# Patient Record
Sex: Male | Born: 1955 | Race: Black or African American | Hispanic: No | Marital: Single | Smoking: Never smoker
Health system: Southern US, Community
[De-identification: ages and names within clinical notes are randomized; demographics above are authoritative.]

## PROBLEM LIST (undated history)

## (undated) DIAGNOSIS — I10 Essential (primary) hypertension: Secondary | ICD-10-CM

## (undated) HISTORY — PX: BACK SURGERY: SHX140

---

## 2000-04-16 ENCOUNTER — Emergency Department (HOSPITAL_COMMUNITY): Admission: EM | Admit: 2000-04-16 | Discharge: 2000-04-16 | Payer: Self-pay | Admitting: Emergency Medicine

## 2000-07-19 ENCOUNTER — Encounter: Payer: Self-pay | Admitting: Emergency Medicine

## 2000-07-19 ENCOUNTER — Inpatient Hospital Stay (HOSPITAL_COMMUNITY): Admission: EM | Admit: 2000-07-19 | Discharge: 2000-07-20 | Payer: Self-pay | Admitting: Neurosurgery

## 2000-08-05 ENCOUNTER — Ambulatory Visit (HOSPITAL_COMMUNITY): Admission: RE | Admit: 2000-08-05 | Discharge: 2000-08-06 | Payer: Self-pay | Admitting: Neurosurgery

## 2002-11-21 ENCOUNTER — Emergency Department (HOSPITAL_COMMUNITY): Admission: EM | Admit: 2002-11-21 | Discharge: 2002-11-21 | Payer: Self-pay | Admitting: Emergency Medicine

## 2002-11-21 ENCOUNTER — Encounter: Payer: Self-pay | Admitting: Emergency Medicine

## 2004-03-04 ENCOUNTER — Emergency Department (HOSPITAL_COMMUNITY): Admission: EM | Admit: 2004-03-04 | Discharge: 2004-03-04 | Payer: Self-pay | Admitting: Emergency Medicine

## 2004-03-10 ENCOUNTER — Encounter: Admission: RE | Admit: 2004-03-10 | Discharge: 2004-03-10 | Payer: Self-pay | Admitting: Internal Medicine

## 2005-07-15 ENCOUNTER — Emergency Department (HOSPITAL_COMMUNITY): Admission: EM | Admit: 2005-07-15 | Discharge: 2005-07-16 | Payer: Self-pay | Admitting: Emergency Medicine

## 2006-05-18 ENCOUNTER — Emergency Department (HOSPITAL_COMMUNITY): Admission: EM | Admit: 2006-05-18 | Discharge: 2006-05-18 | Payer: Self-pay | Admitting: Emergency Medicine

## 2007-04-08 ENCOUNTER — Emergency Department (HOSPITAL_COMMUNITY): Admission: EM | Admit: 2007-04-08 | Discharge: 2007-04-08 | Payer: Self-pay | Admitting: Emergency Medicine

## 2007-09-14 ENCOUNTER — Emergency Department (HOSPITAL_COMMUNITY): Admission: EM | Admit: 2007-09-14 | Discharge: 2007-09-14 | Payer: Self-pay | Admitting: Emergency Medicine

## 2008-07-07 ENCOUNTER — Emergency Department (HOSPITAL_COMMUNITY): Admission: EM | Admit: 2008-07-07 | Discharge: 2008-07-07 | Payer: Self-pay | Admitting: Emergency Medicine

## 2008-08-21 ENCOUNTER — Emergency Department (HOSPITAL_COMMUNITY): Admission: EM | Admit: 2008-08-21 | Discharge: 2008-08-21 | Payer: Self-pay | Admitting: Emergency Medicine

## 2009-01-31 ENCOUNTER — Emergency Department (HOSPITAL_COMMUNITY): Admission: EM | Admit: 2009-01-31 | Discharge: 2009-01-31 | Payer: Self-pay | Admitting: Emergency Medicine

## 2009-05-15 ENCOUNTER — Emergency Department (HOSPITAL_COMMUNITY): Admission: EM | Admit: 2009-05-15 | Discharge: 2009-05-15 | Payer: Self-pay | Admitting: Emergency Medicine

## 2009-05-16 ENCOUNTER — Ambulatory Visit: Payer: Self-pay | Admitting: Internal Medicine

## 2009-05-16 DIAGNOSIS — E109 Type 1 diabetes mellitus without complications: Secondary | ICD-10-CM | POA: Insufficient documentation

## 2009-08-19 ENCOUNTER — Emergency Department (HOSPITAL_COMMUNITY): Admission: EM | Admit: 2009-08-19 | Discharge: 2009-08-19 | Payer: Self-pay | Admitting: Family Medicine

## 2011-03-31 LAB — POCT I-STAT, CHEM 8
BUN: 9 mg/dL (ref 6–23)
Calcium, Ion: 1.21 mmol/L (ref 1.12–1.32)
Chloride: 105 mEq/L (ref 96–112)
Creatinine, Ser: 1.2 mg/dL (ref 0.4–1.5)
Glucose, Bld: 84 mg/dL (ref 70–99)

## 2011-05-11 NOTE — Op Note (Signed)
[place]. [facility]  Patient:    Ricky Savage, Ricky Savage                       MRN: [id number] Proc. Date: 08/05/00 Adm. Date:  [id number] Attending:  Tressie Stalker D                           Operative Report  BRIEF HISTORY:  The patient is a 55 year old black male who has suffered from a several week history of severe back and right leg pain.  The patient failed medical management and was worked up with a lumbar MRI that demonstrated large herniated nucleus pulposus at L5-S1 on the right. The patient therefore weighed the risks, benefits and alternatives of surgery and decided to proceed with a microdiskectomy.  PREOPERATIVE DIAGNOSIS:  L5-S1 degenerative disease, herniated nucleus pulposus, spinal stenosis, and lumbar radiculopathy.  POSTOPERATIVE DIAGNOSIS:  L5-S1 degenerative disease, herniated nucleus pulposus, spinal stenosis, and lumbar radiculopathy.  PROCEDURE:  Right L5-S1 microdiskectomy using microdissection.  SURGEON:  Cristi Loron, M.D.  ASSISTANT:  Hewitt Shorts, M.D.  ANESTHESIA:  General endotracheal anesthesia.  ESTIMATED BLOOD LOSS:  250 cc.  SPECIMENS:  None.  DRAINS:  None.  COMPLICATIONS:  None.  DESCRIPTION OF PROCEDURE:  The patient was brought to the operating room by the anesthesia team.  General endotracheal anesthesia was induced.  The patient was then turned to the prone position on the Wilson frame.  His lumbosacral region was then prepared with Betadine scrub and Betadine solution.  Sterile drapes were applied, and then I injected the area to be incised with Marcaine with epinephrine solution.  I then used a scalpel to make a vertical incision over the L5-S1 interspace in the midline.  I used electrocautery to dissect down to thoracolumbar fascia, divided the fascia just to the right of midline, and performed a right-sided dissection, stripping the paraspinous musculature from the right spinous process of  the lamina of L5 and the upper sacrum.   I inserted a McCullough retractor for exposure and obtained an intraoperative radiograph to confirm my location.  I then brought the operating microscope into the field and under its magnification and illumination, I completed decompression.  I used a Midas Rex high speed drill to perform a right L5 laminotomy drilling in a cephalad direction until I encountered the insertion of ligament of flavum. I then removed the right L5-S1 ligament of flavum with a Kerrison punch, widening the laminotomy, exposed the thecal sac, and identified a huge ruptured disk in the lateral recess which had sandwiched itself between the right S1 and S2 nerve root.  Using microdissection I freed up the disk herniation from the nerve roots and performed a diskectomy using the pituitary forceps.  I then used the coronary dilator to feel about the thecal sac and the right S1 and S2 nerve roots, and noted that all the free fragments were removed.  I then carefully retracted the thecal sac and the S1, S2 nerves medially and then inspected the intervertebral disk space.  There was a small hole in the annulus but it was quite small and I therefore I chose not to enter into the interspace.  I coagulated some epidural veins.  I then copiously irrigated the wound out with bacitracin solution, removed the solution.  I palpated about the ventral surface of the thecal sac and around the right S1 and S2 nerve roots and noted  that the neural structures were well decompressed and then removed the McCullough retractor and then reapproximated the patients back and lumbar fascia with interrupted #1 Vicryl, the subcutaneous tissue with interrupted 2-0 Vicryl and the skin with Steri-Strips and benzoin.  The wounds were then coated with bacitracin ointment.  A sterile dressing was applied and the drapes were removed and the patient was returned to his supine position where he was extubated by  the anesthesia team and transported to the post anesthesia care unit in stable condition.  All sponge, instrument and needle counts were correct at the end of this case. DD:  08/05/00 TD:  08/06/00 Job: [id number]
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
## 2011-05-11 NOTE — Discharge Summary (Signed)
[place]. [facility]  Patient:    Ricky Savage                        MRN: [id number] Adm. Date:  [id number] Disc. Date: [id number] Attending:  Devoria Albe                           Discharge Summary  For full details of this admission, please refer to typed history and physical.  BRIEF HISTORY:  The patient is a 55 year old black male, who has suffered from some chronic back pain and began having severe right leg pain yesterday.  He was evaluated at the [facility] Emergency Department and neurosurgical consultation was requested.  I subsequently admitted him for IV pain control, i.e. with intractable lumbago and leg pain.  For past medical history, past surgical history, medication prior to admission, drug allergies, family medical history, social history, admission physical examination, imaging studies, assessment and plan, etc., please refer to the transcribed history and physical.  HOSPITAL COURSE:  I admitted the patient with intractable lumbar radiculopathy and lumbago.  A lumbar MRI was performed and demonstrated a herniated nucleus pulposus at L5-S1 on the right.  The patients physical examination was consistent with the right S1 radiculopathy.  I discussed the situation with the patient and discussed the various treatment options with him including continuing medical management, given more time, steroid injections, and surgery.  I described the procedure of a L5-S1 microdiskectomy and I discussed the risks of surgery including risks of anesthesia, hemorrhage, infection, dural tear, injury to the lumbar nerve root causing temporary or permanent leg pain, numbness, weakness, small chance of ______ syndrome, recurrent disk herniation, failure to relieve the pain, worsening of the pain, etc.  By July 20, 2000, the patients pain was adequately under control with p.o. medications and we therefore decided to discharge him home and he wants to proceed  with the microdiskectomy at some point next week.  I have asked him to call me on Monday and we will make the necessary arrangements.  FINAL DIAGNOSES:  L5-S1 degenerative disease, herniated nucleus pulposus, spinal stenosis, lumbar radiculopathy.  DISCHARGE MEDICATIONS: 1. Percocet 5, #60, one or two p.o. q.4h. p.r.n. for pain limiting 8 per    day, no refills. 2. Medrol Dosepak #1 take as directed, no refills. 3. Valium 5 mg, #40, one p.o. q.6h. p.r.n. for muscle spasm, one refill. DD:  07/20/00 TD:  07/23/00 Job: [id number]

## 2011-05-11 NOTE — H&P (Signed)
[place]. [facility]  Patient:    Ricky Savage, Ricky Savage                       MRN: [id number] Adm. Date:  [id number] Attending:  Tressie Stalker D                         History and Physical  NO DICTATION. DD:  08/05/00 TD:  08/05/00 Job: [id number]

## 2011-05-11 NOTE — Discharge Summary (Signed)
[place]. [facility]  Patient:    Ricky Savage, Ricky Savage                       MRN: [id number] Adm. Date:  [id number] Disc. Date: [id number] Attending:  Tressie Stalker D                           Discharge Summary  For full details of this admission, please refer to typed history and physical.  BRIEF HISTORY:  The patient is a 56 year old black male who suffered from back and right leg pain unresponsive to medical management.  He was worked up with a lumbar MRI that demonstrated a large herniated disc at L5-S1 on the right. The patient weighed the risks, benefits, and alternatives of surgery and decided to proceed with a microdiskectomy.  For past medical history, past surgical history, medications prior to admission, drug allergies, family medical history, social history, admission physical examination, imaging status, assessment and plat, etc., please refer to typed history and physical.  HOSPITAL COURSE:  I performed an L5-S1 microdiskectomy on the right without complications on August 05, 2000, (for full details of this operation, please refer to typed operative note).  POSTOPERATIVE COURSE:  The patients postoperative course was unremarkable. By postoperative day #1 he was eating well, ambulating well, his wound was healing well without signs of infection.  He was afebrile and vital signs were stable.  Motor strength was normal and his leg pain had resolved.  He requested discharge home.  I, therefore, discharged him on August 06, 2000.  DISCHARGE MEDICATIONS: 1. Percocet #60 one to two p.o. q.4h. p.r.n. for pain with no refills. 2. Valium 5 mg #40 one p.o. q.6h. p.r.n. for muscle spasm with one refill.  DISCHARGE INSTRUCTIONS:  The patient was given written discharge instructions.  FOLLOW-UP:  He was instructed to follow up with me in three weeks.  FINAL DIAGNOSES: 1. L5-S1 degenerative disease. 2. Herniated nucleus pulposus. 3. Spinal stenosis. 4.  Lumbar radiculopathy.  PROCEDURE PERFORMED:  A right L5-S1 microdiskectomy using microdissection. DD:  08/06/00 TD:  08/07/00 Job: [id number]
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
## 2012-03-15 ENCOUNTER — Emergency Department (HOSPITAL_COMMUNITY)
Admission: EM | Admit: 2012-03-15 | Discharge: 2012-03-16 | Disposition: A | Discharge status: Home / Self Care | Payer: Medicaid Other | Attending: Emergency Medicine | Admitting: Emergency Medicine

## 2012-03-15 ENCOUNTER — Encounter (HOSPITAL_COMMUNITY): Payer: Self-pay

## 2012-03-15 ENCOUNTER — Emergency Department (HOSPITAL_COMMUNITY): Payer: Medicaid Other

## 2012-03-15 DIAGNOSIS — X500XXA Overexertion from strenuous movement or load, initial encounter: Secondary | ICD-10-CM | POA: Insufficient documentation

## 2012-03-15 DIAGNOSIS — M7989 Other specified soft tissue disorders: Secondary | ICD-10-CM | POA: Insufficient documentation

## 2012-03-15 DIAGNOSIS — M25569 Pain in unspecified knee: Secondary | ICD-10-CM | POA: Insufficient documentation

## 2012-03-15 DIAGNOSIS — M25561 Pain in right knee: Secondary | ICD-10-CM

## 2012-03-15 DIAGNOSIS — I10 Essential (primary) hypertension: Secondary | ICD-10-CM | POA: Insufficient documentation

## 2012-03-15 HISTORY — DX: Essential (primary) hypertension: I10

## 2012-03-15 MED ORDER — HYDROCODONE-ACETAMINOPHEN 5-325 MG PO TABS: 1.0000 | ORAL_TABLET | ORAL | Status: AC | PRN

## 2012-03-15 MED ORDER — IBUPROFEN 800 MG PO TABS
800.0000 mg | ORAL_TABLET | Freq: Once | ORAL | Status: AC
  Administered 2012-03-15: 800 mg via ORAL
  Filled 2012-03-15: qty 1

## 2012-03-15 NOTE — ED Notes (Signed)
Pt presents with no acute distress twisted right knee Thursday- Increased pain with walking- more at night with sleep- no deformity

## 2012-03-15 NOTE — ED Provider Notes (Signed)
History     CSN: [id number]  Arrival date & time 03/15/12  2012   First MD Initiated Contact with Patient 03/15/12 2145      Chief Complaint  Patient presents with  . Knee Pain    (Consider location/radiation/quality/duration/timing/severity/associated sxs/prior treatment) HPI Comments: Patient here with right knee pain - states that he has a long history of pain and injury in the knee since the past 10 years - states that on Thursday he twisted the knee and then began to have pain behind his right knee - states that he has not seen an orthopedic surgeon in the past 10 years but that he has had to stop playing basketball - denies numbness, tingling, fever, chills, swelling in the knee   Patient is a 56 y.o. male presenting with knee pain. The history is provided by the patient. No language interpreter was used.  Knee Pain This is a new problem. The current episode started in the past 7 days. The problem occurs constantly. The problem has been unchanged. Associated symptoms include arthralgias and joint swelling. Pertinent negatives include no abdominal pain, anorexia, change in bowel habit, chest pain, chills, congestion, coughing, diaphoresis, fatigue, fever, headaches, myalgias, nausea, neck pain, numbness, rash, sore throat, swollen glands, urinary symptoms, vertigo, visual change, vomiting or weakness. The symptoms are aggravated by bending, walking and standing. He has tried nothing for the symptoms. The treatment provided no relief.    Past Medical History  Diagnosis Date  . Hypertension     Past Surgical History  Procedure Date  . Back surgery     No family history on file.  History  Substance Use Topics  . Smoking status: Former Games developer  . Smokeless tobacco: Not on file  . Alcohol Use: No      Review of Systems  Constitutional: Negative for fever, chills, diaphoresis and fatigue.  HENT: Negative for congestion, sore throat and neck pain.   Respiratory: Negative  for cough.   Cardiovascular: Negative for chest pain.  Gastrointestinal: Negative for nausea, vomiting, abdominal pain, anorexia and change in bowel habit.  Musculoskeletal: Positive for joint swelling and arthralgias. Negative for myalgias.  Skin: Negative for rash.  Neurological: Negative for vertigo, weakness, numbness and headaches.  All other systems reviewed and are negative.    Allergies  Review of patient's allergies indicates no known allergies.  Home Medications   Current Outpatient Rx  Name Route Sig Dispense Refill  . VICODIN PO Oral Take 2 tablets by mouth every 6 (six) hours as needed. Unknown strength.    Marland Kitchen LISINOPRIL 20 MG PO TABS Oral Take 20 mg by mouth daily.      BP 143/100  Pulse 79  Temp(Src) 98.7 F (37.1 C) (Oral)  Resp 18  Ht 6' (1.829 m)  Wt 170 lb (77.111 kg)  BMI 23.06 kg/m2  SpO2 99%  Physical Exam  Nursing note and vitals reviewed. Constitutional: He is oriented to person, place, and time. He appears well-developed and well-nourished. No distress.  HENT:  Head: Normocephalic and atraumatic.  Right Ear: External ear normal.  Left Ear: External ear normal.  Nose: Nose normal.  Mouth/Throat: Oropharynx is clear and moist. No oropharyngeal exudate.  Eyes: Conjunctivae are normal. Pupils are equal, round, and reactive to light. No scleral icterus.  Neck: Normal range of motion. Neck supple.  Cardiovascular: Normal rate, regular rhythm and normal heart sounds.  Exam reveals no gallop and no friction rub.   No murmur heard. Pulmonary/Chest: Effort normal and  breath sounds normal. No respiratory distress. He exhibits no tenderness.  Abdominal: Soft. Bowel sounds are normal. He exhibits no distension. There is no tenderness.  Musculoskeletal:       Right knee: He exhibits abnormal meniscus. He exhibits normal range of motion, no swelling, no effusion, no ecchymosis, no deformity and normal patellar mobility. tenderness found.       Pain with  palpation of posterior knee - no pain on varus or valgus stress - mild pain with Lachman - no effusion.  Lymphadenopathy:    He has no cervical adenopathy.  Neurological: He is alert and oriented to person, place, and time. No cranial nerve deficit.  Skin: Skin is warm and dry. No rash noted. No erythema. No pallor.  Psychiatric: He has a normal mood and affect. His behavior is normal. Judgment and thought content normal.    ED Course  Procedures (including critical care time)  Labs Reviewed - No data to display No results found.  Results for orders placed during the hospital encounter of 08/19/09  POCT I-STAT, CHEM 8      Component Value Range   Sodium 141  135 - 145 (mEq/L)   Potassium 4.3  3.5 - 5.1 (mEq/L)   Chloride 105  96 - 112 (mEq/L)   BUN 9  6 - 23 (mg/dL)   Creatinine, Ser 1.2  0.4 - 1.5 (mg/dL)   Glucose, Bld 84  70 - 99 (mg/dL)   Calcium, Ion 0.86  5.78 - 1.32 (mmol/L)   TCO2 26  0 - 100 (mmol/L)   Hemoglobin 15.3  13.0 - 17.0 (g/dL)   HCT 46.9  62.9 - 52.8 (%)   Dg Knee Complete 4 Views Right  03/15/2012  *RADIOLOGY REPORT*  Clinical Data: Chronic knee pain, injury several years ago, pain increased in the last day, question recurrent twist injury  RIGHT KNEE - COMPLETE 4+ VIEW  Comparison: None available  Findings: Osseous demineralization. Joint spaces preserved. Knee joint effusion present. No acute fracture, dislocation or bone destruction.  IMPRESSION: Osseous demineralization. Knee joint effusion. No acute bony abnormalities.  Original Report Authenticated By: Lollie Marrow, M.D.    Right knee effusion    MDM  Patient with acute on chronic right knee pain after twisting - pain to posterior of knee and joint effusion noted, likely PCL vs meniscal tear - have placed in knee sleeve, pain control and will refer to ortho for further evaluation.        Izola Price [place], [place] 03/15/12 2339

## 2012-03-15 NOTE — Discharge Instructions (Signed)
Knee Pain The knee is the complex joint between your thigh and your lower leg. It is made up of bones, tendons, ligaments, and cartilage. The bones that make up the knee are:  The femur in the thigh.   The tibia and fibula in the lower leg.   The patella or kneecap riding in the groove on the lower femur.  CAUSES  Knee pain is a common complaint with many causes. A few of these causes are:  Injury, such as:   A ruptured ligament or tendon injury.   Torn cartilage.   Medical conditions, such as:   Gout   Arthritis   Infections   Overuse, over training or overdoing a physical activity.  Knee pain can be minor or severe. Knee pain can accompany debilitating injury. Minor knee problems often respond well to self-care measures or get well on their own. More serious injuries may need medical intervention or even surgery. SYMPTOMS The knee is complex. Symptoms of knee problems can vary widely. Some of the problems are:  Pain with movement and weight bearing.   Swelling and tenderness.   Buckling of the knee.   Inability to straighten or extend your knee.   Your knee locks and you cannot straighten it.   Warmth and redness with pain and fever.   Deformity or dislocation of the kneecap.  DIAGNOSIS  Determining what is wrong may be very straight forward such as when there is an injury. It can also be challenging because of the complexity of the knee. Tests to make a diagnosis may include:  Your caregiver taking a history and doing a physical exam.   Routine X-rays can be used to rule out other problems. X-rays will not reveal a cartilage tear. Some injuries of the knee can be diagnosed by:   Arthroscopy a surgical technique by which a small video camera is inserted through tiny incisions on the sides of the knee. This procedure is used to examine and repair internal knee joint problems. Tiny instruments can be used during arthroscopy to repair the torn knee cartilage  (meniscus).   Arthrography is a radiology technique. A contrast liquid is directly injected into the knee joint. Internal structures of the knee joint then become visible on X-ray film.   An MRI scan is a non x-ray radiology procedure in which magnetic fields and a computer produce two- or three-dimensional images of the inside of the knee. Cartilage tears are often visible using an MRI scanner. MRI scans have largely replaced arthrography in diagnosing cartilage tears of the knee.   Blood work.   Examination of the fluid that helps to lubricate the knee joint (synovial fluid). This is done by taking a sample out using a needle and a syringe.  TREATMENT The treatment of knee problems depends on the cause. Some of these treatments are:  Depending on the injury, proper casting, splinting, surgery or physical therapy care will be needed.   Give yourself adequate recovery time. Do not overuse your joints. If you begin to get sore during workout routines, back off. Slow down or do fewer repetitions.   For repetitive activities such as cycling or running, maintain your strength and nutrition.   Alternate muscle groups. For example if you are a weight lifter, work the upper body on one day and the lower body the next.   Either tight or weak muscles do not give the proper support for your knee. Tight or weak muscles do not absorb the stress placed   on the knee joint. Keep the muscles surrounding the knee strong.   Take care of mechanical problems.   If you have flat feet, orthotics or special shoes may help. See your caregiver if you need help.   Arch supports, sometimes with wedges on the inner or outer aspect of the heel, can help. These can shift pressure away from the side of the knee most bothered by osteoarthritis.   A brace called an "unloader" brace also may be used to help ease the pressure on the most arthritic side of the knee.   If your caregiver has prescribed crutches, braces,  wraps or ice, use as directed. The acronym for this is PRICE. This means protection, rest, ice, compression and elevation.   Nonsteroidal anti-inflammatory drugs (NSAID's), can help relieve pain. But if taken immediately after an injury, they may actually increase swelling. Take NSAID's with food in your stomach. Stop them if you develop stomach problems. Do not take these if you have a history of ulcers, stomach pain or bleeding from the bowel. Do not take without your caregiver's approval if you have problems with fluid retention, heart failure, or kidney problems.   For ongoing knee problems, physical therapy may be helpful.   Glucosamine and chondroitin are over-the-counter dietary supplements. Both may help relieve the pain of osteoarthritis in the knee. These medicines are different from the usual anti-inflammatory drugs. Glucosamine may decrease the rate of cartilage destruction.   Injections of a corticosteroid drug into your knee joint may help reduce the symptoms of an arthritis flare-up. They may provide pain relief that lasts a few months. You may have to wait a few months between injections. The injections do have a small increased risk of infection, water retention and elevated blood sugar levels.   Hyaluronic acid injected into damaged joints may ease pain and provide lubrication. These injections may work by reducing inflammation. A series of shots may give relief for as long as 6 months.   Topical painkillers. Applying certain ointments to your skin may help relieve the pain and stiffness of osteoarthritis. Ask your pharmacist for suggestions. Many over the-counter products are approved for temporary relief of arthritis pain.   In some countries, doctors often prescribe topical NSAID's for relief of chronic conditions such as arthritis and tendinitis. A review of treatment with NSAID creams found that they worked as well as oral medications but without the serious side effects.    PREVENTION  Maintain a healthy weight. Extra pounds put more strain on your joints.   Get strong, stay limber. Weak muscles are a common cause of knee injuries. Stretching is important. Include flexibility exercises in your workouts.   Be smart about exercise. If you have osteoarthritis, chronic knee pain or recurring injuries, you may need to change the way you exercise. This does not mean you have to stop being active. If your knees ache after jogging or playing basketball, consider switching to swimming, water aerobics or other low-impact activities, at least for a few days a week. Sometimes limiting high-impact activities will provide relief.   Make sure your shoes fit well. Choose footwear that is right for your sport.   Protect your knees. Use the proper gear for knee-sensitive activities. Use kneepads when playing volleyball or laying carpet. Buckle your seat belt every time you drive. Most shattered kneecaps occur in car accidents.   Rest when you are tired.  SEEK MEDICAL CARE IF:  You have knee pain that is continual and does not   seem to be getting better.  SEEK IMMEDIATE MEDICAL CARE IF:  Your knee joint feels hot to the touch and you have a high fever. MAKE SURE YOU:   Understand these instructions.   Will watch your condition.   Will get help right away if you are not doing well or get worse.  Document Released: 10/07/2007 Document Revised: 11/29/2011 Document Reviewed: 10/07/2007 [facility] Patient Information 2012 [place], [place].Knee Wraps (Elastic Bandage) and RICE Knee wraps come in many different shapes and sizes and perform many different functions. Some wraps may provide cold therapy or warmth. Your caregiver will help you to determine what is best for your protection, or recovery following your injury. The following are some general tips to help you use a knee wrap:  Use the wrap as directed.   Do not keep the wrap so tight that it cuts off the circulation of the  leg below the wrap.   If your lower leg becomes blue, loses feeling, or becomes swollen below the wrap, it is probably too tight. Loosen the wrap as needed to improve these problems.   See your caregiver or trainer if the wrap seems to be making your problems worse rather than better.  Wraps in general help to remind you that you have an injury. They provide limited support. The few pounds of support they provide are minimal considering the hundreds of pounds of pressure it takes to injure a joint or tear ligaments.  The routine care of many injuries includes Rest, Ice, Compression, and Elevation (RICE).  Rest is required to allow your body to heal. Generally following bumps and bruises, routine activities can be resumed when comfortable. Injured tendons (cord-like structures that attach muscle to bone) and bones take approximately 6 to 12 weeks to heal.   Ice following an injury helps keep the swelling down and reduces pain. Do not apply ice directly to skin. Apply ice bags for 20-30 minutes every 3-4 hours for the first 2-3 days following injury or surgery. Place ice in a plastic bag with a towel around it.   Compression helps keep swelling down, gives support, and helps with discomfort. If a knee wrap has been applied, it should be removed and reapplied every 3 to 4 hours. It should be applied firmly enough to keep swelling down, but not too tightly. Watch your lower leg and toes for swelling, bluish discoloration, coldness, numbness or excessive pain. If any of these symptoms (problems) occur, remove the knee wrap and reapply more loosely. If these symptoms persist, contact your caregiver immediately.   Elevation helps reduce swelling, and decreases pain. With extremities (arms/hands and legs/feet), the injured area should be placed near to or above the level of the heart if possible.  Persistent pain and inability to use the injured area for more than 2 to 3 days are warning signs indicating that  you should see a caregiver for a follow-up visit as soon as possible. Initially, a hairline fracture (this is the same as a broken bone) may not be seen on x-rays.  Persistent pain and swelling mean limitedweight bearing (use of crutches as instructed) should continue. You may need further x-rays.  X-rays may not show a non-displaced fracture until a week or ten days later. Make a follow-up appointment with your caregiver. A radiologist (a specialist in reading x-rays) will re-read your x-rays. Make sure you know how to get your x-ray results. Do not assume everything is normal if you do not hear from your caregiver.  MAKE SURE YOU:   Understand these instructions.   Will watch your condition.   Will get help right away if you are not doing well or get worse.  Document Released: 06/01/2002 Document Revised: 11/29/2011 Document Reviewed: 03/31/2009 [facility] Patient Information 2012 [place], [place].

## 2012-03-16 NOTE — ED Provider Notes (Signed)
Medical screening examination/treatment/procedure(s) were performed by non-physician practitioner and as supervising physician I was immediately available for consultation/collaboration.   Rolan Bucco, MD 03/16/12 [phone]

## 2013-01-25 ENCOUNTER — Emergency Department (HOSPITAL_COMMUNITY)
Admission: EM | Admit: 2013-01-25 | Discharge: 2013-01-25 | Discharge status: Against Medical Advice | Payer: Medicaid Other | Attending: Emergency Medicine | Admitting: Emergency Medicine

## 2013-01-25 ENCOUNTER — Encounter (HOSPITAL_COMMUNITY): Payer: Self-pay | Admitting: Emergency Medicine

## 2013-01-25 ENCOUNTER — Emergency Department (HOSPITAL_COMMUNITY): Payer: Medicaid Other

## 2013-01-25 DIAGNOSIS — M549 Dorsalgia, unspecified: Secondary | ICD-10-CM

## 2013-01-25 DIAGNOSIS — Z87448 Personal history of other diseases of urinary system: Secondary | ICD-10-CM | POA: Insufficient documentation

## 2013-01-25 DIAGNOSIS — M545 Low back pain, unspecified: Secondary | ICD-10-CM | POA: Insufficient documentation

## 2013-01-25 DIAGNOSIS — I10 Essential (primary) hypertension: Secondary | ICD-10-CM | POA: Insufficient documentation

## 2013-01-25 DIAGNOSIS — Z9889 Other specified postprocedural states: Secondary | ICD-10-CM | POA: Insufficient documentation

## 2013-01-25 DIAGNOSIS — Z79899 Other long term (current) drug therapy: Secondary | ICD-10-CM | POA: Insufficient documentation

## 2013-01-25 DIAGNOSIS — F1410 Cocaine abuse, uncomplicated: Secondary | ICD-10-CM | POA: Insufficient documentation

## 2013-01-25 DIAGNOSIS — Z87891 Personal history of nicotine dependence: Secondary | ICD-10-CM | POA: Insufficient documentation

## 2013-01-25 LAB — BASIC METABOLIC PANEL
BUN: 8 mg/dL (ref 6–23)
CO2: 25 mEq/L (ref 19–32)
Calcium: 9.4 mg/dL (ref 8.4–10.5)
Glucose, Bld: 136 mg/dL — ABNORMAL HIGH (ref 70–99)
Sodium: 137 mEq/L (ref 135–145)

## 2013-01-25 MED ORDER — SODIUM CHLORIDE 0.9 % IV BOLUS (SEPSIS)
1000.0000 mL | Freq: Once | INTRAVENOUS | Status: AC
  Administered 2013-01-25: 1000 mL via INTRAVENOUS

## 2013-01-25 MED ORDER — MORPHINE SULFATE 4 MG/ML IJ SOLN
4.0000 mg | Freq: Once | INTRAMUSCULAR | Status: AC
  Administered 2013-01-25: 4 mg via INTRAVENOUS
  Filled 2013-01-25: qty 1

## 2013-01-25 MED ORDER — KETOROLAC TROMETHAMINE 30 MG/ML IJ SOLN
30.0000 mg | Freq: Once | INTRAMUSCULAR | Status: AC
  Administered 2013-01-25: 30 mg via INTRAVENOUS
  Filled 2013-01-25: qty 1

## 2013-01-25 MED ORDER — ONDANSETRON HCL 4 MG/2ML IJ SOLN
4.0000 mg | Freq: Once | INTRAMUSCULAR | Status: AC
  Administered 2013-01-25: 4 mg via INTRAVENOUS
  Filled 2013-01-25: qty 2

## 2013-01-25 NOTE — ED Notes (Signed)
Pt alert and oriented x4. Respirations even and unlabored. Bilateral rise and fall of chest. Skin warm and dry. In no acute distress. Denies needs.  

## 2013-01-25 NOTE — ED Provider Notes (Signed)
Medical screening examination/treatment/procedure(s) were performed by non-physician practitioner and as supervising physician I was immediately available for consultation/collaboration.   Cynde Menard E Alanni Vader, MD 01/25/13 1454 

## 2013-01-25 NOTE — ED Notes (Signed)
Patient states he has got to go eat. Sandwich offered but he refused. States he's worried about the pain coming back. Says he will be back tonight or tomorrow.

## 2013-01-25 NOTE — ED Notes (Signed)
Patient walking around in room talking on cell phone. Still denies being able to void.

## 2013-01-25 NOTE — ED Notes (Signed)
Pt is aware that we need urine.  Sts he can not provide a sample at this time.

## 2013-01-25 NOTE — ED Notes (Signed)
Pt sts that he still can not provide a urine sample.

## 2013-01-25 NOTE — ED Provider Notes (Signed)
History     CSN: [id number]  Arrival date & time 01/25/13  1012   First MD Initiated Contact with Patient 01/25/13 1044      Chief Complaint  Patient presents with  . Back Pain    (Consider location/radiation/quality/duration/timing/severity/associated sxs/prior treatment) HPI Comments: Pt states that he has had problems urinating and noticed blood in the urine over the last month:pt denies penile discharge, vomiting,diarrhea, fever:pt denies history of kidney stone:pt states that he has had back surgery and has 2 more area that need to be taken care of but this doesn't feel like his normal back problems  Patient is a 57 y.o. male presenting with back pain. The history is provided by the patient. No language interpreter was used.  Back Pain  This is a new problem. The current episode started yesterday. The problem occurs hourly. The problem has been gradually worsening. The pain is associated with no known injury. The quality of the pain is described as stabbing. The pain does not radiate. The pain is severe. The pain is the same all the time.    Past Medical History  Diagnosis Date  . Hypertension     Past Surgical History  Procedure Date  . Back surgery     No family history on file.  History  Substance Use Topics  . Smoking status: Former Games developer  . Smokeless tobacco: Never Used  . Alcohol Use: No      Review of Systems  Constitutional: Negative.   Respiratory: Negative.   Cardiovascular: Negative.   Musculoskeletal: Positive for back pain.    Allergies  Review of patient's allergies indicates no known allergies.  Home Medications   Current Outpatient Rx  Name  Route  Sig  Dispense  Refill  . HYDROCODONE-ACETAMINOPHEN 2.5-500 MG PO TABS   Oral   Take 1 tablet by mouth every 6 (six) hours as needed. Pain         . LISINOPRIL 20 MG PO TABS   Oral   Take 20 mg by mouth daily.           BP 140/72  Pulse 87  Temp 98.7 F (37.1 C) (Oral)  Resp 20   SpO2 97%  Physical Exam  Nursing note and vitals reviewed. Constitutional: He is oriented to person, place, and time. He appears well-developed and well-nourished.  HENT:  Head: Normocephalic and atraumatic.  Eyes: Conjunctivae normal and EOM are normal. Pupils are equal, round, and reactive to light.  Neck: Normal range of motion. Neck supple.  Cardiovascular: Normal rate and regular rhythm.   Pulmonary/Chest: Effort normal and breath sounds normal.  Abdominal: Soft. Bowel sounds are normal. There is no tenderness.  Musculoskeletal: Normal range of motion.  Neurological: He is alert and oriented to person, place, and time.  Skin: Skin is warm and dry.    ED Course  Procedures (including critical care time)  Labs Reviewed  BASIC METABOLIC PANEL - Abnormal; Notable for the following:    Potassium 3.4 (*)     Glucose, Bld 136 (*)     GFR calc non Af Amer 64 (*)     GFR calc Af Amer 74 (*)     All other components within normal limits  URINALYSIS, ROUTINE W REFLEX MICROSCOPIC   Ct Abdomen Pelvis Wo Contrast  01/25/2013  *RADIOLOGY REPORT*  Clinical Data: Low back pain, hematuria.  CT ABDOMEN AND PELVIS WITHOUT CONTRAST  Technique:  Multidetector CT imaging of the abdomen and pelvis was performed following  the standard protocol without intravenous contrast.  Comparison: None.  Findings: Large subpleural blebs noted anteriorly at both lung bases.  Unremarkable uninfused evaluation of the nondistended gallbladder, liver, spleen, adrenal glands, kidneys, pancreas. Patchy aortoiliac arterial calcifications without aneurysm. Stomach is distended by ingested material.  Small bowel and colon are nondilated.  Urinary bladder incompletely distended.  Mild prostatic enlargement.  No ascites.  No free air.  Normal appendix. No adenopathy localized.  Degenerative disc disease L5-S1.  IMPRESSION: 1.  No acute abdominal process. 2.  Subpleural blebs in both lung bases. 3.  Advanced degenerative disc  disease L5-S1.   Original Report Authenticated By: D. Andria Rhein, MD      1. Back pain       MDM  Pt is refusing to give urine and refusing to have a cath:discussed with pt unable to tell if there is a prostatitis:pt states that he will come back tomorrow       Teressa Lower, NP 01/25/13 1445

## 2013-01-25 NOTE — ED Notes (Signed)
Pt informed that we need him to provide a urine sample or he will need an In and Out cath.  Pt walked to the restroom, but unable to urinate.  Pt is refusing to be In and Out cathed.  NP notified.

## 2013-01-25 NOTE — ED Notes (Addendum)
Pt states having lower back pain, denies urinary sx or hx of kidney stones. Denies N/V/D, fever or flu type sx. Pt endorses hx of hematuria about 1 month ago.

## 2013-01-25 NOTE — ED Notes (Signed)
Pt chose to leave AMA.  Benefits of completing care were explained.  Pt sts that he will return tomorrow.

## 2015-05-03 ENCOUNTER — Ambulatory Visit (HOSPITAL_COMMUNITY)
Admission: RE | Admit: 2015-05-03 | Discharge: 2015-05-03 | Disposition: A | Discharge status: Home / Self Care | Payer: Medicaid Other | Source: Ambulatory Visit | Attending: Internal Medicine | Admitting: Internal Medicine

## 2015-05-03 ENCOUNTER — Other Ambulatory Visit (HOSPITAL_COMMUNITY): Payer: Self-pay | Admitting: Internal Medicine

## 2015-05-03 DIAGNOSIS — I10 Essential (primary) hypertension: Secondary | ICD-10-CM | POA: Diagnosis not present

## 2015-05-03 DIAGNOSIS — J439 Emphysema, unspecified: Secondary | ICD-10-CM | POA: Insufficient documentation

## 2016-05-22 ENCOUNTER — Encounter (HOSPITAL_COMMUNITY): Payer: Self-pay | Admitting: Emergency Medicine

## 2016-05-22 ENCOUNTER — Emergency Department (HOSPITAL_COMMUNITY)
Admission: EM | Admit: 2016-05-22 | Discharge: 2016-05-22 | Disposition: A | Discharge status: Home / Self Care | Payer: Medicaid Other | Attending: Emergency Medicine | Admitting: Emergency Medicine

## 2016-05-22 DIAGNOSIS — R42 Dizziness and giddiness: Secondary | ICD-10-CM | POA: Diagnosis not present

## 2016-05-22 DIAGNOSIS — I10 Essential (primary) hypertension: Secondary | ICD-10-CM | POA: Diagnosis not present

## 2016-05-22 DIAGNOSIS — Z79899 Other long term (current) drug therapy: Secondary | ICD-10-CM | POA: Diagnosis not present

## 2016-05-22 DIAGNOSIS — Z87891 Personal history of nicotine dependence: Secondary | ICD-10-CM | POA: Insufficient documentation

## 2016-05-22 DIAGNOSIS — M545 Low back pain, unspecified: Secondary | ICD-10-CM

## 2016-05-22 DIAGNOSIS — Z9889 Other specified postprocedural states: Secondary | ICD-10-CM | POA: Diagnosis not present

## 2016-05-22 LAB — I-STAT CHEM 8, ED
BUN: 20 mg/dL (ref 6–20)
CREATININE: 1.1 mg/dL (ref 0.61–1.24)
Calcium, Ion: 1.16 mmol/L (ref 1.12–1.23)
Chloride: 104 mmol/L (ref 101–111)
Glucose, Bld: 88 mg/dL (ref 65–99)
HEMATOCRIT: 39 % (ref 39.0–52.0)
Hemoglobin: 13.3 g/dL (ref 13.0–17.0)
POTASSIUM: 4.8 mmol/L (ref 3.5–5.1)
Sodium: 140 mmol/L (ref 135–145)
TCO2: 25 mmol/L (ref 0–100)

## 2016-05-22 MED ORDER — CYCLOBENZAPRINE HCL 10 MG PO TABS
10.0000 mg | ORAL_TABLET | Freq: Once | ORAL | Status: AC
  Administered 2016-05-22: 10 mg via ORAL
  Filled 2016-05-22: qty 1

## 2016-05-22 MED ORDER — NAPROXEN 500 MG PO TABS: 500.0000 mg | ORAL_TABLET | Freq: Two times a day (BID) | ORAL | Status: DC

## 2016-05-22 MED ORDER — CYCLOBENZAPRINE HCL 10 MG PO TABS: 10.0000 mg | ORAL_TABLET | Freq: Three times a day (TID) | ORAL | Status: DC | PRN

## 2016-05-22 MED ORDER — HYDROCHLOROTHIAZIDE 25 MG PO TABS: 25.0000 mg | ORAL_TABLET | Freq: Every day | ORAL | Status: DC

## 2016-05-22 MED ORDER — KETOROLAC TROMETHAMINE 30 MG/ML IJ SOLN
30.0000 mg | Freq: Once | INTRAMUSCULAR | Status: AC
  Administered 2016-05-22: 30 mg via INTRAMUSCULAR
  Filled 2016-05-22: qty 1

## 2016-05-22 NOTE — ED Notes (Signed)
Pt to ER by private vehicle with complaint of lower back pain onset yesterday while playing with grandchildren. Pt reports hx of back problems. Also reports has not been able to obtain BP medication due to cost. Pt BP 152/115. NAD. A/O x4. Ambulatory to patient room.

## 2016-05-22 NOTE — ED Provider Notes (Signed)
CSN: [id number]     Arrival date & time 05/22/16  0920 History   First MD Initiated Contact with Patient 05/22/16 [phone]     Chief Complaint  Patient presents with  . Back Pain     (Consider location/radiation/quality/duration/timing/severity/associated sxs/prior Treatment) HPI  60 year old male with a history of hypertension and prior back surgery presents with acute low back pain since last night. Patient states he was playing with his grandchildren "did too much" and had sudden low back pain. The pain in his mid low back and does not radiate. No abdominal pain. No direct trauma. He states he was twisting while picking up on his grandchildren and felt a sudden pain. No weakness or numbness in his lower extremities. No bowel or bladder incontinence. Patient also asking for his blood pressure to be checked because he felt dizzy earlier this morning. He states he gets this way a couple times a month and has always attributed it to a blood pressure. He used to be on lisinopril but was taken off this because of a cough. His PCP put him on a different medicine but insurance will pay for it anymore. He sees his PCP in one month. Currently off all blood pressure medicines. Has not taken anything for the pain today. Currently his back pain is 8/10.  Past Medical History  Diagnosis Date  . Hypertension    Past Surgical History  Procedure Laterality Date  . Back surgery     History reviewed. No pertinent family history. Social History  Substance Use Topics  . Smoking status: Former Games developer  . Smokeless tobacco: Never Used  . Alcohol Use: No    Review of Systems  Respiratory: Negative for shortness of breath.   Cardiovascular: Negative for chest pain.  Gastrointestinal: Negative for abdominal pain.  Musculoskeletal: Positive for back pain.  Neurological: Positive for dizziness. Negative for weakness, numbness and headaches.  All other systems reviewed and are negative.     Allergies   Review of patient's allergies indicates no known allergies.  Home Medications   Prior to Admission medications   Medication Sig Start Date End Date Taking? Authorizing Provider  HYDROcodone-acetaminophen (VICODIN) 2.5-500 MG per tablet Take 1 tablet by mouth every 6 (six) hours as needed. Pain    Historical Provider, MD  lisinopril (PRINIVIL,ZESTRIL) 20 MG tablet Take 20 mg by mouth daily.    Historical Provider, MD   BP 152/115 mmHg  Pulse 68  Temp(Src) 97.6 F (36.4 C) (Oral)  Resp 16  SpO2 95% Physical Exam  Constitutional: He is oriented to person, place, and time. He appears well-developed and well-nourished.  HENT:  Head: Normocephalic and atraumatic.  Right Ear: External ear normal.  Left Ear: External ear normal.  Nose: Nose normal.  Eyes: Right eye exhibits no discharge. Left eye exhibits no discharge.  Neck: Neck supple.  Cardiovascular: Normal rate, regular rhythm, normal heart sounds and intact distal pulses.   Pulmonary/Chest: Effort normal and breath sounds normal.  Abdominal: Soft. There is no tenderness.  Musculoskeletal: He exhibits no edema.       Thoracic back: He exhibits no tenderness and no bony tenderness.       Lumbar back: He exhibits no tenderness and no bony tenderness.  No reproducible back tenderness. Patient states the pain is "on the inside"  Neurological: He is alert and oriented to person, place, and time.  CN 3-12 grossly intact. 5/5 strength in all 4 extremities. Grossly normal sensation.  Skin: Skin is warm and  dry.  Nursing note and vitals reviewed.   ED Course  Procedures (including critical care time) Labs Review Labs Reviewed  I-STAT CHEM 8, ED    Imaging Review No results found. I have personally reviewed and evaluated these images and lab results as part of my medical decision-making.   EKG Interpretation None      MDM   Final diagnoses:  Acute low back pain  Essential hypertension    Patient with what appears to  be in acute low back strain. Patient had no direct trauma to his back. Has a normal neuro exam. Very low suspicion of acute fracture or spinal emergency. No abdominal pain or tenderness. Given this occurred after twisting and have very low suspicion of an intra-abdominal process such as aortic aneurysm. Will treat pain with anti-inflammatories and muscle relaxers. Patient requests something for blood pressure and states he sees his PCP in about 1 week. Discussed return precautions.    Pricilla LovelessScott Christa Fasig, MD 05/22/16 1141

## 2017-06-05 ENCOUNTER — Emergency Department (HOSPITAL_COMMUNITY)
Admission: EM | Admit: 2017-06-05 | Discharge: 2017-06-06 | Disposition: A | Discharge status: Home / Self Care | Payer: Medicaid Other | Attending: Emergency Medicine | Admitting: Emergency Medicine

## 2017-06-05 ENCOUNTER — Encounter (HOSPITAL_COMMUNITY): Payer: Self-pay

## 2017-06-05 ENCOUNTER — Emergency Department (HOSPITAL_COMMUNITY): Payer: Medicaid Other

## 2017-06-05 DIAGNOSIS — J441 Chronic obstructive pulmonary disease with (acute) exacerbation: Secondary | ICD-10-CM

## 2017-06-05 DIAGNOSIS — I10 Essential (primary) hypertension: Secondary | ICD-10-CM | POA: Insufficient documentation

## 2017-06-05 DIAGNOSIS — Z87891 Personal history of nicotine dependence: Secondary | ICD-10-CM | POA: Diagnosis not present

## 2017-06-05 DIAGNOSIS — J45901 Unspecified asthma with (acute) exacerbation: Secondary | ICD-10-CM | POA: Insufficient documentation

## 2017-06-05 DIAGNOSIS — Z79899 Other long term (current) drug therapy: Secondary | ICD-10-CM | POA: Diagnosis not present

## 2017-06-05 DIAGNOSIS — E109 Type 1 diabetes mellitus without complications: Secondary | ICD-10-CM | POA: Diagnosis not present

## 2017-06-05 DIAGNOSIS — R0602 Shortness of breath: Secondary | ICD-10-CM | POA: Diagnosis present

## 2017-06-05 LAB — CBC WITH DIFFERENTIAL/PLATELET
BASOS ABS: 0 10*3/uL (ref 0.0–0.1)
Basophils Relative: 0 %
Eosinophils Absolute: 0.2 10*3/uL (ref 0.0–0.7)
Eosinophils Relative: 5 %
HEMATOCRIT: 44.8 % (ref 39.0–52.0)
HEMOGLOBIN: 15.1 g/dL (ref 13.0–17.0)
LYMPHS ABS: 1.1 10*3/uL (ref 0.7–4.0)
LYMPHS PCT: 26 %
MCH: 33 pg (ref 26.0–34.0)
MCHC: 33.7 g/dL (ref 30.0–36.0)
MCV: 98 fL (ref 78.0–100.0)
Monocytes Absolute: 0.6 10*3/uL (ref 0.1–1.0)
Monocytes Relative: 15 %
NEUTROS ABS: 2.2 10*3/uL (ref 1.7–7.7)
Neutrophils Relative %: 54 %
Platelets: 270 10*3/uL (ref 150–400)
RBC: 4.57 MIL/uL (ref 4.22–5.81)
RDW: 13.3 % (ref 11.5–15.5)
WBC: 4 10*3/uL (ref 4.0–10.5)

## 2017-06-05 LAB — COMPREHENSIVE METABOLIC PANEL
ALK PHOS: 72 U/L (ref 38–126)
ALT: 12 U/L — AB (ref 17–63)
AST: 21 U/L (ref 15–41)
Albumin: 3.6 g/dL (ref 3.5–5.0)
Anion gap: 8 (ref 5–15)
BILIRUBIN TOTAL: 0.8 mg/dL (ref 0.3–1.2)
BUN: 7 mg/dL (ref 6–20)
CALCIUM: 9.1 mg/dL (ref 8.9–10.3)
CHLORIDE: 105 mmol/L (ref 101–111)
CO2: 26 mmol/L (ref 22–32)
CREATININE: 1.16 mg/dL (ref 0.61–1.24)
GFR calc Af Amer: >60 mL/min (ref >60)
Glucose, Bld: 169 mg/dL — ABNORMAL HIGH (ref 65–99)
Potassium: 3.6 mmol/L (ref 3.5–5.1)
Sodium: 139 mmol/L (ref 135–145)
TOTAL PROTEIN: 7.2 g/dL (ref 6.5–8.1)

## 2017-06-05 LAB — I-STAT TROPONIN, ED: Troponin i, poc: 0 ng/mL (ref 0.00–0.08)

## 2017-06-05 MED ORDER — ALBUTEROL SULFATE (2.5 MG/3ML) 0.083% IN NEBU
INHALATION_SOLUTION | RESPIRATORY_TRACT | Status: AC
  Filled 2017-06-05: qty 6

## 2017-06-05 MED ORDER — IPRATROPIUM BROMIDE HFA 17 MCG/ACT IN AERS: 2.0000 | INHALATION_SPRAY | RESPIRATORY_TRACT | Status: DC

## 2017-06-05 MED ORDER — PREDNISONE 10 MG PO TABS: 60.0000 mg | ORAL_TABLET | Freq: Every day | ORAL | 0 refills | Status: DC

## 2017-06-05 MED ORDER — IPRATROPIUM BROMIDE 0.02 % IN SOLN
0.5000 mg | RESPIRATORY_TRACT | Status: DC
  Administered 2017-06-05: 0.5 mg via RESPIRATORY_TRACT
  Filled 2017-06-05: qty 2.5

## 2017-06-05 MED ORDER — ALBUTEROL (5 MG/ML) CONTINUOUS INHALATION SOLN
10.0000 mg/h | INHALATION_SOLUTION | Freq: Once | RESPIRATORY_TRACT | Status: AC
  Administered 2017-06-05: 10 mg/h via RESPIRATORY_TRACT
  Filled 2017-06-05: qty 20

## 2017-06-05 MED ORDER — ALBUTEROL SULFATE (2.5 MG/3ML) 0.083% IN NEBU: 5.0000 mg | INHALATION_SOLUTION | Freq: Once | RESPIRATORY_TRACT | Status: DC

## 2017-06-05 MED ORDER — ALBUTEROL SULFATE HFA 108 (90 BASE) MCG/ACT IN AERS: 2.0000 | INHALATION_SPRAY | Freq: Four times a day (QID) | RESPIRATORY_TRACT | 2 refills | Status: AC | PRN

## 2017-06-05 MED ORDER — DEXAMETHASONE 4 MG PO TABS
10.0000 mg | ORAL_TABLET | Freq: Once | ORAL | Status: AC
  Administered 2017-06-05: 10 mg via ORAL
  Filled 2017-06-05: qty 3

## 2017-06-05 NOTE — ED Provider Notes (Signed)
[facility] Provider Note   CSN: [id number] Arrival date & time: 06/05/17  1514     History   Chief Complaint Chief Complaint  Patient presents with  . Shortness of Breath    HPI Ricky Savage is a 61 y.o. male.  HPI PT comes in with cc of DIB. Pt has hx of HTN and extensive smoking hx He reports that he has had some DIB for the last several days. Now the breathing is worse and thus he decided to come to the ER. Pt has cough - but it's not new, and it is a dry cough. There is no chest pain, fevers, hemoptysis. Pt is having wheezing. No hx of COPD/Asthma.  Past Medical History:  Diagnosis Date  . Hypertension     Patient Active Problem List   Diagnosis Date Noted  . DIABETES MELLITUS TYPE 1 05/16/2009    Past Surgical History:  Procedure Laterality Date  . BACK SURGERY         Home Medications    Prior to Admission medications   Medication Sig Start Date End Date Taking? Authorizing Provider  albuterol (PROVENTIL HFA;VENTOLIN HFA) 108 (90 Base) MCG/ACT inhaler Inhale 2 puffs into the lungs every 6 (six) hours as needed for wheezing or shortness of breath. 06/05/17   Derwood KaplanNanavati, Marelyn Rouser, MD  cyclobenzaprine (FLEXERIL) 10 MG tablet Take 1 tablet (10 mg total) by mouth 3 (three) times daily as needed for muscle spasms. Patient not taking: Reported on 06/05/2017 05/22/16   Pricilla LovelessGoldston, Scott, MD  hydrochlorothiazide (HYDRODIURIL) 25 MG tablet Take 1 tablet (25 mg total) by mouth daily. Patient not taking: Reported on 06/05/2017 05/22/16   Pricilla LovelessGoldston, Scott, MD  naproxen (NAPROSYN) 500 MG tablet Take 1 tablet (500 mg total) by mouth 2 (two) times daily. Patient not taking: Reported on 06/05/2017 05/22/16   Pricilla LovelessGoldston, Scott, MD  predniSONE (DELTASONE) 10 MG tablet Take 6 tablets (60 mg total) by mouth daily. 06/05/17   Derwood KaplanNanavati, Ronnald Shedden, MD    Family History No family history on file.  Social History Social History  Substance Use Topics  . Smoking status: Former Games developermoker  .  Smokeless tobacco: Never Used  . Alcohol use No     Allergies   Patient has no known allergies.   Review of Systems Review of Systems  Constitutional: Positive for activity change. Negative for fever.  Respiratory: Positive for cough and shortness of breath. Negative for chest tightness.   Cardiovascular: Negative for chest pain.  Skin: Negative for rash.     Physical Exam Updated Vital Signs BP (!) 145/89   Pulse 92   Temp 98.3 F (36.8 C) (Oral)   Resp (!) 23   Ht 6' (1.829 m)   Wt 72.6 kg (160 lb)   SpO2 90%   BMI 21.70 kg/m   Physical Exam  Constitutional: He is oriented to person, place, and time. He appears well-developed.  HENT:  Head: Atraumatic.  Neck: Neck supple.  Cardiovascular: Normal rate.   Pulmonary/Chest: Effort normal. No respiratory distress. He has wheezes.  Abdominal: Soft.  Musculoskeletal: He exhibits no edema or tenderness.  Neurological: He is alert and oriented to person, place, and time.  Skin: Skin is warm.  Nursing note and vitals reviewed.    ED Treatments / Results  Labs (all labs ordered are listed, but only abnormal results are displayed) Labs Reviewed  COMPREHENSIVE METABOLIC PANEL - Abnormal; Notable for the following:       Result Value   Glucose,  Bld 169 (*)    ALT 12 (*)    All other components within normal limits  CBC WITH DIFFERENTIAL/PLATELET  Rosezena Sensor, ED    EKG  EKG Interpretation  Date/Time:  Wednesday June 05 2017 15:20:24 EDT Ventricular Rate:  95 PR Interval:  154 QRS Duration: 68 QT Interval:  356 QTC Calculation: 447 R Axis:   65 Text Interpretation:  Sinus rhythm with Fusion complexes Biatrial enlargement Anteroseptal infarct , age undetermined Abnormal ECG No acute changes Confirmed by Derwood Kaplan ([id number]) on 06/05/2017 8:00:02 PM       Radiology Dg Chest 2 View  Result Date: 06/05/2017 CLINICAL DATA:  Short of breath EXAM: CHEST  2 VIEW COMPARISON:  05/03/2015 FINDINGS: COPD  with pulmonary hyperinflation and pulmonary scarring. Negative for heart failure or pneumonia. Negative for mass lesion. Lungs remain clear. IMPRESSION: Advanced COPD without acute cardiopulmonary abnormality. Electronically Signed   By: Marlan Palau M.D.   On: 06/05/2017 16:07    Procedures Procedures (including critical care time)  Medications Ordered in ED Medications  albuterol (PROVENTIL,VENTOLIN) solution continuous neb (10 mg/hr Nebulization Given 06/05/17 2159)  dexamethasone (DECADRON) tablet 10 mg (10 mg Oral Given 06/05/17 2123)     Initial Impression / Assessment and Plan / ED Course  I have reviewed the triage vital signs and the nursing notes.  Pertinent labs & imaging results that were available during my care of the patient were reviewed by me and considered in my medical decision making (see chart for details).     Pt appears to have new onset COPD. After multiple nebs, wheezing cleared. PT is stable for d/c. PCP f/u advised.  Final Clinical Impressions(s) / ED Diagnoses   Final diagnoses:  COPD exacerbation (HCC)    New Prescriptions Discharge Medication List as of 06/05/2017 11:36 PM    START taking these medications   Details  albuterol (PROVENTIL HFA;VENTOLIN HFA) 108 (90 Base) MCG/ACT inhaler Inhale 2 puffs into the lungs every 6 (six) hours as needed for wheezing or shortness of breath., Starting Wed 06/05/2017, Print    predniSONE (DELTASONE) 10 MG tablet Take 6 tablets (60 mg total) by mouth daily., Starting Wed 06/05/2017, Print         Derwood Kaplan, MD 06/06/17 [phone]

## 2017-06-05 NOTE — ED Notes (Signed)
RT aware of order for CAT.

## 2017-06-05 NOTE — ED Notes (Signed)
Dr. Nanavati at bedside 

## 2017-06-05 NOTE — ED Notes (Signed)
Pt to NF asking about wait times, pt informed his name is in a room and he will be brought back to his room momentarily after being cleaned.

## 2017-06-05 NOTE — ED Notes (Signed)
Pt asked to wait for IV until after seen by EDP.  Warm blanket given and tv turned per pt's request.  Awaiting EDP assessment.

## 2017-06-05 NOTE — Discharge Instructions (Signed)
We saw you in the ER for your breathing related complains. We gave you some breathing treatments in the ER, and seems like your symptoms have improved. °Please take albuterol as needed every 4 hours. °Please take the medications prescribed. °Please refrain from smoking or smoke exposure. °Please see a primary care doctor in 1 week. °Return to the ER if your symptoms worsen. ° °

## 2017-06-05 NOTE — ED Triage Notes (Signed)
Pt presents to the ed with complaints of shortness of breath for a month, breathing equal and mildly labored with wheezing present, breathing treatment given in triage.  Reports not using his inhaler for awhile.  The patient also complains of flank pain on his left side.

## 2017-06-06 NOTE — ED Notes (Signed)
Pt understood dc material. NAD noted. Script given at dc  

## 2018-03-03 ENCOUNTER — Emergency Department (HOSPITAL_COMMUNITY)
Admission: EM | Admit: 2018-03-03 | Discharge: 2018-03-03 | Discharge status: Against Medical Advice | Payer: Medicaid Other | Attending: Emergency Medicine | Admitting: Emergency Medicine

## 2018-03-03 ENCOUNTER — Emergency Department (HOSPITAL_COMMUNITY): Payer: Medicaid Other

## 2018-03-03 ENCOUNTER — Encounter (HOSPITAL_COMMUNITY): Payer: Self-pay

## 2018-03-03 ENCOUNTER — Other Ambulatory Visit: Payer: Self-pay

## 2018-03-03 DIAGNOSIS — R0602 Shortness of breath: Secondary | ICD-10-CM | POA: Diagnosis present

## 2018-03-03 DIAGNOSIS — Z5321 Procedure and treatment not carried out due to patient leaving prior to being seen by health care provider: Secondary | ICD-10-CM | POA: Diagnosis not present

## 2018-03-03 DIAGNOSIS — R0981 Nasal congestion: Secondary | ICD-10-CM | POA: Insufficient documentation

## 2018-03-03 LAB — BASIC METABOLIC PANEL
Anion gap: 12 (ref 5–15)
BUN: 8 mg/dL (ref 6–20)
CO2: 27 mmol/L (ref 22–32)
Calcium: 9.2 mg/dL (ref 8.9–10.3)
Chloride: 100 mmol/L — ABNORMAL LOW (ref 101–111)
Creatinine, Ser: 0.99 mg/dL (ref 0.61–1.24)
GFR calc Af Amer: >60 mL/min (ref >60)
GFR calc non Af Amer: >60 mL/min (ref >60)
GLUCOSE: 108 mg/dL — AB (ref 65–99)
POTASSIUM: 3.6 mmol/L (ref 3.5–5.1)
Sodium: 139 mmol/L (ref 135–145)

## 2018-03-03 LAB — CBC
HEMATOCRIT: 41.4 % (ref 39.0–52.0)
HEMOGLOBIN: 14 g/dL (ref 13.0–17.0)
MCH: 33.3 pg (ref 26.0–34.0)
MCHC: 33.8 g/dL (ref 30.0–36.0)
MCV: 98.6 fL (ref 78.0–100.0)
Platelets: 346 10*3/uL (ref 150–400)
RBC: 4.2 MIL/uL — ABNORMAL LOW (ref 4.22–5.81)
RDW: 12.9 % (ref 11.5–15.5)
WBC: 8.1 10*3/uL (ref 4.0–10.5)

## 2018-03-03 MED ORDER — PREDNISONE 20 MG PO TABS
60.0000 mg | ORAL_TABLET | Freq: Once | ORAL | Status: DC
  Filled 2018-03-03: qty 3

## 2018-03-03 MED ORDER — IPRATROPIUM-ALBUTEROL 0.5-2.5 (3) MG/3ML IN SOLN
3.0000 mL | Freq: Once | RESPIRATORY_TRACT | Status: DC
  Filled 2018-03-03: qty 3

## 2018-03-03 MED ORDER — BENZONATATE 100 MG PO CAPS
200.0000 mg | ORAL_CAPSULE | Freq: Once | ORAL | Status: DC
  Filled 2018-03-03: qty 2

## 2018-03-03 NOTE — ED Triage Notes (Signed)
Pt reports URI symptoms X1 week. States nasal congestion, cough, headache.

## 2018-03-03 NOTE — ED Provider Notes (Signed)
Patient placed in Quick Look pathway, seen and evaluated   Chief Complaint: shortness of breath and nasal congestion.   HPI:  Pt reports he has had symptoms for about 8 days. Reports nasal congestion, productive cough, states it is brown. Denies chest pain. Reports subjective fever and chills. Denies trying any treatment prior to coming in. Stats tried inhalers but "not as supposed to, and its not helping." denies n/v/d  ROS: positive for nasal congestion, cough, shortness of breath, fever, chills. Negative for sore throat, nasuea, vomiting, chest pain, abdominal pain.   Physical Exam:   Gen: No distress  Neuro: Awake and Alert  Skin: Warm    Focused Exam: coughing. Nasal congestion present. Oropharynx normal. Decreased air movement bilaterally, prolonged expiration. Regular HR and rhythm. Normal S1 and S2  Pt with cough and congestion. Subjective fever at home, here afebrile. Question influenza, possible bronchitis given hx of copd. Labs, cxr ordered. Will treat with tessalon, duoneb, prednisone.   Vitals:   03/03/18 1919  BP: (!) 146/101  Pulse: 86  Temp: 99.4 F (37.4 C)  SpO2: 96%      Initiation of care has begun. The patient has been counseled on the process, plan, and necessity for staying for the completion/evaluation, and the remainder of the medical screening examination    Iona CoachKirichenko, Emmersen Garraway, PA-C 03/03/18 1932    Azalia Bilisampos, Kevin, MD 03/03/18 2300

## 2018-03-03 NOTE — ED Notes (Signed)
Patient up to desk asking about wait time, adding that his back is hurting and he wants to go home and lie down. Encouraged patient to wait a bit longer but he says he will come back early tomorrow morning.

## 2018-06-27 ENCOUNTER — Encounter (HOSPITAL_COMMUNITY): Payer: Self-pay | Admitting: Emergency Medicine

## 2018-06-27 ENCOUNTER — Ambulatory Visit (HOSPITAL_COMMUNITY)
Admission: EM | Admit: 2018-06-27 | Discharge: 2018-06-27 | Disposition: A | Discharge status: Home / Self Care | Payer: Medicaid Other | Attending: Family Medicine | Admitting: Family Medicine

## 2018-06-27 DIAGNOSIS — M545 Low back pain: Secondary | ICD-10-CM

## 2018-06-27 DIAGNOSIS — I10 Essential (primary) hypertension: Secondary | ICD-10-CM

## 2018-06-27 DIAGNOSIS — G8929 Other chronic pain: Secondary | ICD-10-CM

## 2018-06-27 DIAGNOSIS — J439 Emphysema, unspecified: Secondary | ICD-10-CM

## 2018-06-27 DIAGNOSIS — M47816 Spondylosis without myelopathy or radiculopathy, lumbar region: Secondary | ICD-10-CM

## 2018-06-27 DIAGNOSIS — M961 Postlaminectomy syndrome, not elsewhere classified: Secondary | ICD-10-CM

## 2018-06-27 MED ORDER — HYDROCODONE-ACETAMINOPHEN 5-325 MG PO TABS: 1.0000 | ORAL_TABLET | ORAL | 0 refills | Status: DC | PRN

## 2018-06-27 MED ORDER — CYCLOBENZAPRINE HCL 5 MG PO TABS: 5.0000 mg | ORAL_TABLET | Freq: Three times a day (TID) | ORAL | 0 refills | Status: DC | PRN

## 2018-06-27 MED ORDER — IBUPROFEN 800 MG PO TABS: 800.0000 mg | ORAL_TABLET | Freq: Three times a day (TID) | ORAL | 0 refills | Status: DC | PRN

## 2018-06-27 NOTE — ED Triage Notes (Signed)
Pt hx of back issues and surgery, states hes had back pain x2 days.

## 2018-06-27 NOTE — Discharge Instructions (Signed)
Activity as tolerated Ice or heat to back area Take ibuprofen 3 times a day with food Take muscle relaxer cyclobenzaprine as needed Take pain medicine when pain severe.  Cautioned drowsiness.  Caution constipation. You need to follow-up with your primary care doctor or your pain specialist for additional pain medicine refills.  Urgent care center does not do chronic pain management.

## 2018-06-27 NOTE — ED Provider Notes (Signed)
[facility]    CSN: [id number] Arrival date & time: 06/27/18  1506     History   Chief Complaint Chief Complaint  Patient presents with  . Back Pain    HPI Ricky Savage is a 62 y.o. male.   HPI  Patient is here complaining of "severe" back pain.  He states it is been more painful for the last 2 days.  He endorses chronic low back pain.  He states that he has degenerative disc disease.  He states he has had surgery, with no improvement.  He states that he previously was under the care of a pain specialist.  He states that he  "took OxyContin for years".  While in [place] he lives with his sister.  He [place] uncertain [place] with his aunt for the last 6 months.  He states he stopped his pain medication at that time.  He says he had no trouble with withdrawal.  He states he is decided to move back to [place].  He states he has not had any pain medication in 6 months.  His old record indicates hypertension.  He is not taking blood pressure medications.  His blood pressure is elevated today.  His old chart indicates diabetes.  He states he never had diabetes. He said no accident or injury.  He said no fall.  He states the pain is in his central low back.  No radiation.  No bowel or bladder complaints.  No recent illness.  No fever or chills.  No urinary complaints.  Past Medical History:  Diagnosis Date  . Hypertension     Patient Active Problem List   Diagnosis Date Noted  . Essential hypertension 06/27/2018  . 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 BACK SURGERY         Home Medications    Prior to Admission medications   Medication Sig Start Date End Date Taking? Authorizing Provider  albuterol (PROVENTIL HFA;VENTOLIN HFA) 108 (90 Base) MCG/ACT inhaler  Inhale 2 puffs into the lungs every 6 (six) hours as needed for wheezing or shortness of breath. 06/05/17   Derwood Kaplan, MD  cyclobenzaprine (FLEXERIL) 5 MG tablet Take 1 tablet (5 mg total) by mouth 3 (three) times daily as needed for muscle spasms. 06/27/18   Eustace Moore, MD  HYDROcodone-acetaminophen (NORCO/VICODIN) 5-325 MG tablet Take 1-2 tablets by mouth every 4 (four) hours as needed. 06/27/18   Eustace Moore, MD  ibuprofen (ADVIL,MOTRIN) 800 MG tablet Take 1 tablet (800 mg total) by mouth every 8 (eight) hours as needed for moderate pain. 06/27/18   Eustace Moore, MD    Family History No family history on file.  Social History Social History   Tobacco Use  . Smoking status: Former Games developer  . Smokeless tobacco: Never Used  Substance Use Topics  . Alcohol use: No  . Drug use: No     Allergies   Patient has no known allergies.   Review of Systems Review of Systems  Constitutional: Negative for chills and fever.  HENT: Negative for ear pain and sore throat.   Eyes: Negative for pain and visual disturbance.  Respiratory: Negative for cough and shortness of breath.   Cardiovascular: Negative for chest pain and palpitations.  Gastrointestinal: Negative for abdominal pain  and vomiting.  Genitourinary: Negative for dysuria and hematuria.  Musculoskeletal: Positive for back pain. Negative for arthralgias.  Skin: Negative for color change and rash.  Neurological: Negative for seizures and syncope.  All other systems reviewed and are negative.    Physical Exam Triage Vital Signs ED Triage Vitals  Enc Vitals Group     BP 06/27/18 1635 (!) 148/118     Pulse Rate 06/27/18 1635 80     Resp 06/27/18 1635 16     Temp 06/27/18 1635 98.2 F (36.8 C)     Temp src --      SpO2 06/27/18 1635 97 %     Weight --      Height --      Head Circumference --      Peak Flow --      Pain Score 06/27/18 1636 10     Pain Loc --      Pain Edu? --      Excl. in GC? --     No data found.  Updated Vital Signs BP (!) 148/118   Pulse 80   Temp 98.2 F (36.8 C)   Resp 16   SpO2 97%       Physical Exam  Constitutional: He appears well-developed and well-nourished. No distress.  HENT:  Head: Normocephalic and atraumatic.  Mouth/Throat: Oropharynx is clear and moist.  Eyes: Pupils are equal, round, and reactive to light. Conjunctivae are normal.  Neck: Normal range of motion.  Cardiovascular: Normal rate.  Pulmonary/Chest: Effort normal. No respiratory distress.  Abdominal: Soft. He exhibits no distension.  Musculoskeletal: Normal range of motion. He exhibits no edema.  Patient moves slowly.  Guarded movements.  Much grimacing and vocalization.  Back is straight and symmetric.  Well-healed lower lumbar scar.  Tenderness palpation diffusely across the muscles, bony structures, and posterior pelvis.  No palpable muscle spasm.  Very limited range of motion.  Strength sensation range of motion and reflexes are normal in both lower extremities.  Straight leg raise is negative bilaterally.  Strength testing reveals cogwheeling.  Axial load testing is positive.  Neurological: He is alert.  Skin: Skin is warm and dry.     UC Treatments / Results  Labs (all labs ordered are listed, but only abnormal results are displayed) Labs Reviewed - No data to display  EKG None  Radiology No results found.  Procedures Procedures (including critical care time)  Medications Ordered in UC Medications - No data to display  Initial Impression / Assessment and Plan / UC Course  I have reviewed the triage vital signs and the nursing notes.  Pertinent labs & imaging results that were available during my care of the patient were reviewed by me and considered in my medical decision making (see chart for details).     [place] database is checked.  The patient apparently got hydrocodone on a regular basis from her primary care doctor for years while he lived in [place].   The prescribing was consistent.  Only one doctor.  Only one pharmacy.  He was hydrocodone and not [facility]cod[facility].  No specialty prescriptions noted.  Evidence of symptomatic medication on physical exam. Final Clinical Impressions(s) / UC Diagnoses   Final diagnoses:  Chronic bilateral low back pain without sciatica     Discharge Instructions     Activity as tolerated Ice or heat to back area Take ibuprofen 3 times a day with food Take muscle relaxer cyclobenzaprine as needed  Take pain medicine when pain severe.  Cautioned drowsiness.  Caution constipation. You need to follow-up with your primary care doctor or your pain specialist for additional pain medicine refills.  Urgent care center does not do chronic pain management.   ED Prescriptions    Medication Sig Dispense Auth. Provider   cyclobenzaprine (FLEXERIL) 5 MG tablet Take 1 tablet (5 mg total) by mouth 3 (three) times daily as needed for muscle spasms. 30 tablet Eustace Moore, MD   ibuprofen (ADVIL,MOTRIN) 800 MG tablet Take 1 tablet (800 mg total) by mouth every 8 (eight) hours as needed for moderate pain. 90 tablet Eustace Moore, MD   HYDROcodone-acetaminophen (NORCO/VICODIN) 5-325 MG tablet Take 1-2 tablets by mouth every 4 (four) hours as needed. 20 tablet Eustace Moore, MD     Controlled Substance Prescriptions [place] Controlled Substance Registry consulted? Not Applicable   Eustace Moore, MD 06/27/18 2117

## 2018-07-16 ENCOUNTER — Other Ambulatory Visit: Payer: Self-pay

## 2018-07-16 ENCOUNTER — Encounter (HOSPITAL_COMMUNITY): Payer: Self-pay | Admitting: Emergency Medicine

## 2018-07-16 ENCOUNTER — Emergency Department (HOSPITAL_COMMUNITY)
Admission: EM | Admit: 2018-07-16 | Discharge: 2018-07-16 | Disposition: A | Discharge status: Home / Self Care | Payer: Medicaid Other | Attending: Emergency Medicine | Admitting: Emergency Medicine

## 2018-07-16 DIAGNOSIS — M5440 Lumbago with sciatica, unspecified side: Secondary | ICD-10-CM | POA: Insufficient documentation

## 2018-07-16 DIAGNOSIS — Z87891 Personal history of nicotine dependence: Secondary | ICD-10-CM | POA: Diagnosis not present

## 2018-07-16 DIAGNOSIS — G8929 Other chronic pain: Secondary | ICD-10-CM

## 2018-07-16 DIAGNOSIS — M545 Low back pain: Secondary | ICD-10-CM

## 2018-07-16 DIAGNOSIS — I10 Essential (primary) hypertension: Secondary | ICD-10-CM

## 2018-07-16 DIAGNOSIS — J449 Chronic obstructive pulmonary disease, unspecified: Secondary | ICD-10-CM | POA: Insufficient documentation

## 2018-07-16 MED ORDER — KETOROLAC TROMETHAMINE 60 MG/2ML IM SOLN
30.0000 mg | Freq: Once | INTRAMUSCULAR | Status: AC
  Administered 2018-07-16: 30 mg via INTRAMUSCULAR
  Filled 2018-07-16: qty 2

## 2018-07-16 MED ORDER — ORPHENADRINE CITRATE ER 100 MG PO TB12: 100.0000 mg | ORAL_TABLET | Freq: Two times a day (BID) | ORAL | 0 refills | Status: AC

## 2018-07-16 NOTE — ED Notes (Signed)
Declined W/C at D/C and was escorted to lobby by RN. 

## 2018-07-16 NOTE — Discharge Instructions (Addendum)
We are unable to refill chronic pain medications in the emergency room. A prescription for Norflex (muscle relaxor) was sent to your pharmacy. I tried to contact your doctor's office without success, I did sent an email to your doctor advising him of your need to a pain management referral (referral given was not helpful and your phone calls have not been returned). You could go to your PCP office directly if they are not returning calls.  Monitor your blood pressure, if you are prescribed blood pressure medication it is important you take them to prevent long term health consequences.

## 2018-07-16 NOTE — ED Provider Notes (Signed)
[facility] Provider Note   CSN: [id number] Arrival date & time: 07/16/18  0932     History   Chief Complaint Chief Complaint  Patient presents with  . Back Pain  . Medication Refill    HPI Ricky Savage is a 62 y.o. male.  62 year old male presents with complaint of chronic low back pain requesting prescription for Norco.  Patient states that he injured his back several years ago and had surgery for 2 discs however has 2 more discs that need surgery.  Patient has been going to his PCP who is prescribed Vicodin for him for several years however PCP recently informed him he is no longer able to prescribe this medication for him and referred him to pain management.  Patient has had the paperwork from the PCP office with him with the referral information, he contacted the pain management provider and was advised by that office that the provider is no longer there.  Patient is reached out to his PCP office for a new referral however his calls have been unanswered.  Patient denies any new symptoms or changes in his chronic pain symptoms or pattern, he denies any falls or injuries, denies any red flag symptoms.  Patient states he does not take ibuprofen because it upsets his stomach otherwise no allergies.  Patient went to urgent care on July 5 and was given a prescription for Norco #20 and has just run out.  No other complaints or concerns today.     Past Medical History:  Diagnosis Date  . Hypertension     Patient Active Problem List   Diagnosis Date Noted  . Essential hypertension 06/27/2018  . 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 BACK SURGERY          Home Medications    Prior to Admission medications   Medication Sig Start Date End Date Taking? Authorizing Provider    albuterol (PROVENTIL HFA;VENTOLIN HFA) 108 (90 Base) MCG/ACT inhaler Inhale 2 puffs into the lungs every 6 (six) hours as needed for wheezing or shortness of breath. 06/05/17   Derwood KaplanNanavati, Ankit, MD  HYDROcodone-acetaminophen (NORCO/VICODIN) 5-325 MG tablet Take 1-2 tablets by mouth every 4 (four) hours as needed. 06/27/18   Eustace MooreNelson, Yvonne Sue, MD  ibuprofen (ADVIL,MOTRIN) 800 MG tablet Take 1 tablet (800 mg total) by mouth every 8 (eight) hours as needed for moderate pain. 06/27/18   Eustace MooreNelson, Yvonne Sue, MD  orphenadrine (NORFLEX) 100 MG tablet Take 1 tablet (100 mg total) by mouth 2 (two) times daily for 10 days. 07/16/18 07/26/18  Jeannie FendMurphy, Laura A, PA-C    Family History No family history on file.  Social History Social History   Tobacco Use  . Smoking status: Former Games developermoker  . Smokeless tobacco: Never Used  Substance Use Topics  . Alcohol use: No  . Drug use: No     Allergies   Patient has no known allergies.   Review of Systems Review of Systems  Constitutional: Negative for fever.  Eyes: Negative for visual disturbance.  Cardiovascular: Negative for chest pain.  Gastrointestinal: Negative for abdominal pain.  Genitourinary: Negative for difficulty urinating.  Musculoskeletal: Positive for back pain.  Skin: Negative for wound.  Neurological: Negative for weakness, numbness and headaches.  Hematological: Does not bruise/bleed easily.  Psychiatric/Behavioral: Negative for confusion.  All  other systems reviewed and are negative.    Physical Exam Updated Vital Signs BP (!) 164/107 (BP Location: Right Arm)   Pulse 76   Temp 97.9 F (36.6 C) (Oral)   Resp 16   Ht 6' (1.829 m)   Wt 77.1 kg (170 lb)   SpO2 95%   BMI 23.06 kg/m   Physical Exam  Constitutional: He is oriented to person, place, and time. He appears well-developed and well-nourished.  HENT:  Head: Normocephalic and atraumatic.  Cardiovascular: Intact distal pulses.  Pulmonary/Chest: Effort normal.   Musculoskeletal: He exhibits tenderness. He exhibits no deformity.       Back:  Neurological: He is alert and oriented to person, place, and time. He has normal strength. Gait normal.  Skin: Skin is warm and dry.  Psychiatric: He has a normal mood and affect. His behavior is normal.  Nursing note and vitals reviewed.    ED Treatments / Results  Labs (all labs ordered are listed, but only abnormal results are displayed) Labs Reviewed - No data to display  EKG None  Radiology No results found.  Procedures Procedures (including critical care time)  Medications Ordered in ED Medications  ketorolac (TORADOL) injection 30 mg (30 mg Intramuscular Given 07/16/18 1021)     Initial Impression / Assessment and Plan / ED Course  I have reviewed the triage vital signs and the nursing notes.  Pertinent labs & imaging results that were available during my care of the patient were reviewed by me and considered in my medical decision making (see chart for details).  Clinical Course as of Jul 16 1026  Wed Jul 16, 2018  [id number] 62 year old male with chronic back pain presents with request for refill of his Norco.  Review of the database shows patient last filled medication from his regular prescriber in January and then again July 5 when he was seen in urgent care and given 20 Vicodin.  Patient has tried to follow-up with pain management however referral given was not valid and is unable to get touch with his PCP for new referral.  I tried to contact PCP office for the patient and the phone was unanswered.  I have sent an email to patient's PCP informing him of the situation and requesting a new referral or to contact the patient.  Patient was given injection of Toradol while in the emergency room and discharged with a prescription for Norflex, discontinue Flexeril although he is not taking the Flexeril as he does not feel this helps with his pain.  Patient has elevated blood pressure today, not on  blood pressure medications, advised him to monitor his blood pressure and follow-up with PCP for this.  Patient is agreeable to discharge plan.   [LM]    Clinical Course User Index [LM] Jeannie Fend, PA-C     Final Clinical Impressions(s) / ED Diagnoses   Final diagnoses:  Chronic midline low back pain, with sciatica presence unspecified  Hypertension, unspecified type    ED Discharge Orders        Ordered    orphenadrine (NORFLEX) 100 MG tablet  2 times daily     07/16/18 1016       Alden Hipp 07/16/18 1027    Mesner, Barbara Cower, MD 07/19/18 [phone]

## 2018-07-16 NOTE — ED Triage Notes (Signed)
Pt. Stated, I was at UC 2 weeks ago and got pain medication and now Im out.

## 2018-07-16 NOTE — ED Triage Notes (Signed)
Pt. Stated, Im having back and saw my Dr. On the July 19 and giving pain med for 9 years and recommend me to a pain doctor. So I need some pain medication.

## 2019-08-05 ENCOUNTER — Other Ambulatory Visit: Payer: Self-pay

## 2019-08-05 DIAGNOSIS — Z20822 Contact with and (suspected) exposure to covid-19: Secondary | ICD-10-CM

## 2019-08-06 LAB — NOVEL CORONAVIRUS, NAA: SARS-CoV-2, NAA: NOT DETECTED

## 2019-12-03 ENCOUNTER — Other Ambulatory Visit: Payer: Self-pay

## 2019-12-03 ENCOUNTER — Encounter (HOSPITAL_COMMUNITY): Payer: Self-pay

## 2019-12-03 ENCOUNTER — Ambulatory Visit (HOSPITAL_COMMUNITY)
Admission: EM | Admit: 2019-12-03 | Discharge: 2019-12-03 | Disposition: A | Discharge status: Home / Self Care | Payer: Medicaid Other | Attending: Family Medicine | Admitting: Family Medicine

## 2019-12-03 DIAGNOSIS — M545 Low back pain: Secondary | ICD-10-CM | POA: Diagnosis not present

## 2019-12-03 DIAGNOSIS — G8929 Other chronic pain: Secondary | ICD-10-CM | POA: Diagnosis not present

## 2019-12-03 MED ORDER — METHOCARBAMOL 500 MG PO TABS: 500.0000 mg | ORAL_TABLET | Freq: Two times a day (BID) | ORAL | 0 refills | Status: DC

## 2019-12-03 MED ORDER — IBUPROFEN 800 MG PO TABS: 800.0000 mg | ORAL_TABLET | Freq: Three times a day (TID) | ORAL | 0 refills | Status: DC

## 2019-12-03 MED ORDER — HYDROCODONE-ACETAMINOPHEN 5-325 MG PO TABS: 1.0000 | ORAL_TABLET | Freq: Four times a day (QID) | ORAL | 0 refills | Status: DC | PRN

## 2019-12-03 NOTE — ED Triage Notes (Signed)
Patient presents to Urgent Care with complaints of lower back pain since this morning. Patient reports he took a muscle relaxer this morning and it did not help.

## 2019-12-03 NOTE — Discharge Instructions (Addendum)
Be aware, pain medications may cause drowsiness. Please do not drive, operate heavy machinery or make important decisions while on this medication, it can cloud your judgement. ° °HOME CARE INSTRUCTIONS: For many people, back pain returns. Since low back pain is rarely dangerous, it is often a condition that people can learn to manage on their own. Please remain active. It is stressful on the back to sit or stand in one place. Do not sit, drive, or stand in one place for more than 30 minutes at a time. Take short walks on level surfaces as soon as pain allows. Try to increase the length of time you walk each day. Do not stay in bed. Resting more than 1 or 2 days can delay your recovery. Do not avoid exercise or work. Your body is made to move. It is not dangerous to be active, even though your back may hurt. Your back will likely heal faster if you return to being active before your pain is gone. Over-the-counter medicines to reduce pain and inflammation are often the most helpful. ° °SEEK MEDICAL CARE IF: °You have pain that is not relieved with rest or medicine. °You have pain that does not improve in 1 week. °You have new symptoms. °You are generally not feeling well. ° °SEEK IMMEDIATE MEDICAL CARE IF: °You have pain that radiates from your back into your legs. °You develop new bowel or bladder control problems. °You have unusual weakness or numbness in your arms or legs. °You develop nausea or vomiting. °You develop abdominal pain. °You feel faint. °

## 2019-12-03 NOTE — ED Provider Notes (Signed)
[place]   [id number] 12/03/19 Arrival Time: 0737  ASSESSMENT & PLAN:  1. Chronic bilateral low back pain without sciatica     Acute on chronic exacerbation without injury/trauma. Ricky to ambulate here and hemodynamically stable. No indication for imaging of back at this time given no trauma and normal neurological exam. Discussed.   Meds ordered this encounter  Medications  . HYDROcodone-acetaminophen (NORCO/VICODIN) 5-325 MG tablet    Sig: Take 1 tablet by mouth every 6 (six) hours as needed for moderate pain or severe pain.    Dispense:  8 tablet    Refill:  0  . ibuprofen (ADVIL) 800 MG tablet    Sig: Take 1 tablet (800 mg total) by mouth 3 (three) times daily with meals.    Dispense:  21 tablet    Refill:  0  . methocarbamol (ROBAXIN) 500 MG tablet    Sig: Take 1 tablet (500 mg total) by mouth 2 (two) times daily.    Dispense:  14 tablet    Refill:  0    Medication sedation precautions given. Encourage ROM/movement as tolerated.  Recommend: Follow-up Information    [facility].   Why: If worsening or failing to improve as anticipated. Contact information: [address] [place] [phone]         See AVS for d/c instructions.  Reviewed expectations re: course of current medical issues. Questions answered. Outlined signs and symptoms indicating need for more acute intervention. Patient verbalized understanding. After Visit Summary given.   SUBJECTIVE: History from: patient.  Ricky Savage is a 63 y.o. adult who presents with complaint of intermittent bilateral lower back discomfort. Onset abrupt. First noted upon waking this morning. Injury/trama: no. History of back problems requiring medical care: frequent; similar symptoms. Pain described as aching and without radiation. Aggravating factors: certain movements and prolonged walking/standing. Alleviating factors: have not  been identified. Progressive LE weakness or saddle anesthesia: none. Extremity sensation changes or weakness: none. Ambulatory without difficulty but needs to move slowly. Normal bowel/bladder habits: yes; without urinary retention. Normal PO intake without n/v. No associated abdominal pain/n/v. Self treatment: has tried acetaminophen, NSAID, with no relief.  Reports no chronic steroid use, fevers, IV drug use, or recent back surgeries or procedures.  ROS: As per HPI. All other systems negative.   OBJECTIVE:  Vitals:   12/03/19 1737  BP: (!) 159/99  Pulse: 82  Resp: 16  Temp: 98.2 F (36.8 C)  TempSrc: Oral  SpO2: 98%    General appearance: alert; no distress but does appear to be in pain HEENT: [place]; AT Neck: supple with FROM; without midline tenderness CV: RRR Lungs: unlabored respirations; symmetrical air entry Abdomen: soft, non-tender; non-distended Back: moderate and poorly localized tenderness to palpation over bilateral lumbar musculature; FROM at waist; bruising: none; without midline tenderness Extremities: without edema; symmetrical without gross deformities; normal ROM of bilateral LE Skin: warm and dry Neurologic: normal gait but moves very slowly; normal reflexes of bilateral LE; normal sensation of bilateral LE; normal strength of bilateral LE Psychological: alert and cooperative; normal mood and affect    No Known Allergies  Past Medical History:  Diagnosis Date  . Hypertension    Social History   Socioeconomic History  . Marital status: Single    Spouse name: Not on file  . Number of children: Not on file  . Years of education: Not on file  . Highest education level: Not on  file  Occupational History  . Not on file  Tobacco Use  . Smoking status: Former Games developer  . Smokeless tobacco: Never Used  Substance and Sexual Activity  . Alcohol use: No  . Drug use: No  . Sexual activity: Not on file  Other Topics Concern  . Not on file  Social History  Narrative  . Not on file   Social Determinants of Health   Financial Resource Strain:   . Difficulty of Paying Living Expenses: Not on file  Food Insecurity:   . Worried About Programme researcher, broadcasting/film/video in the Last Year: Not on file  . Ran Out of Food in the Last Year: Not on file  Transportation Needs:   . Lack of Transportation (Medical): Not on file  . Lack of Transportation (Non-Medical): Not on file  Physical Activity:   . Days of Exercise per Week: Not on file  . Minutes of Exercise per Session: Not on file  Stress:   . Feeling of Stress : Not on file  Social Connections:   . Frequency of Communication with Friends and Family: Not on file  . Frequency of Social Gatherings with Friends and Family: Not on file  . Attends Religious Services: Not on file  . Active Member of Clubs or Organizations: Not on file  . Attends Banker Meetings: Not on file  . Marital Status: Not on file  Intimate Partner Violence:   . Fear of Current or Ex-Partner: Not on file  . Emotionally Abused: Not on file  . Physically Abused: Not on file  . Sexually Abused: Not on file   Family History  Problem Relation Age of Onset  . Healthy Mother   . Cancer Father    Past Surgical History:  Procedure Laterality Date  . BACK SURGERY       Mardella Layman, MD 12/08/19 [phone]

## 2020-01-01 IMAGING — DX DG CHEST 2V
2 series · 2 of 2 positions shown · non-contrast
Comparison: 06/05/2017

CLINICAL DATA: Chest pain and shortness of Breath

EXAM:
CHEST - 2 VIEW

[chest pa]
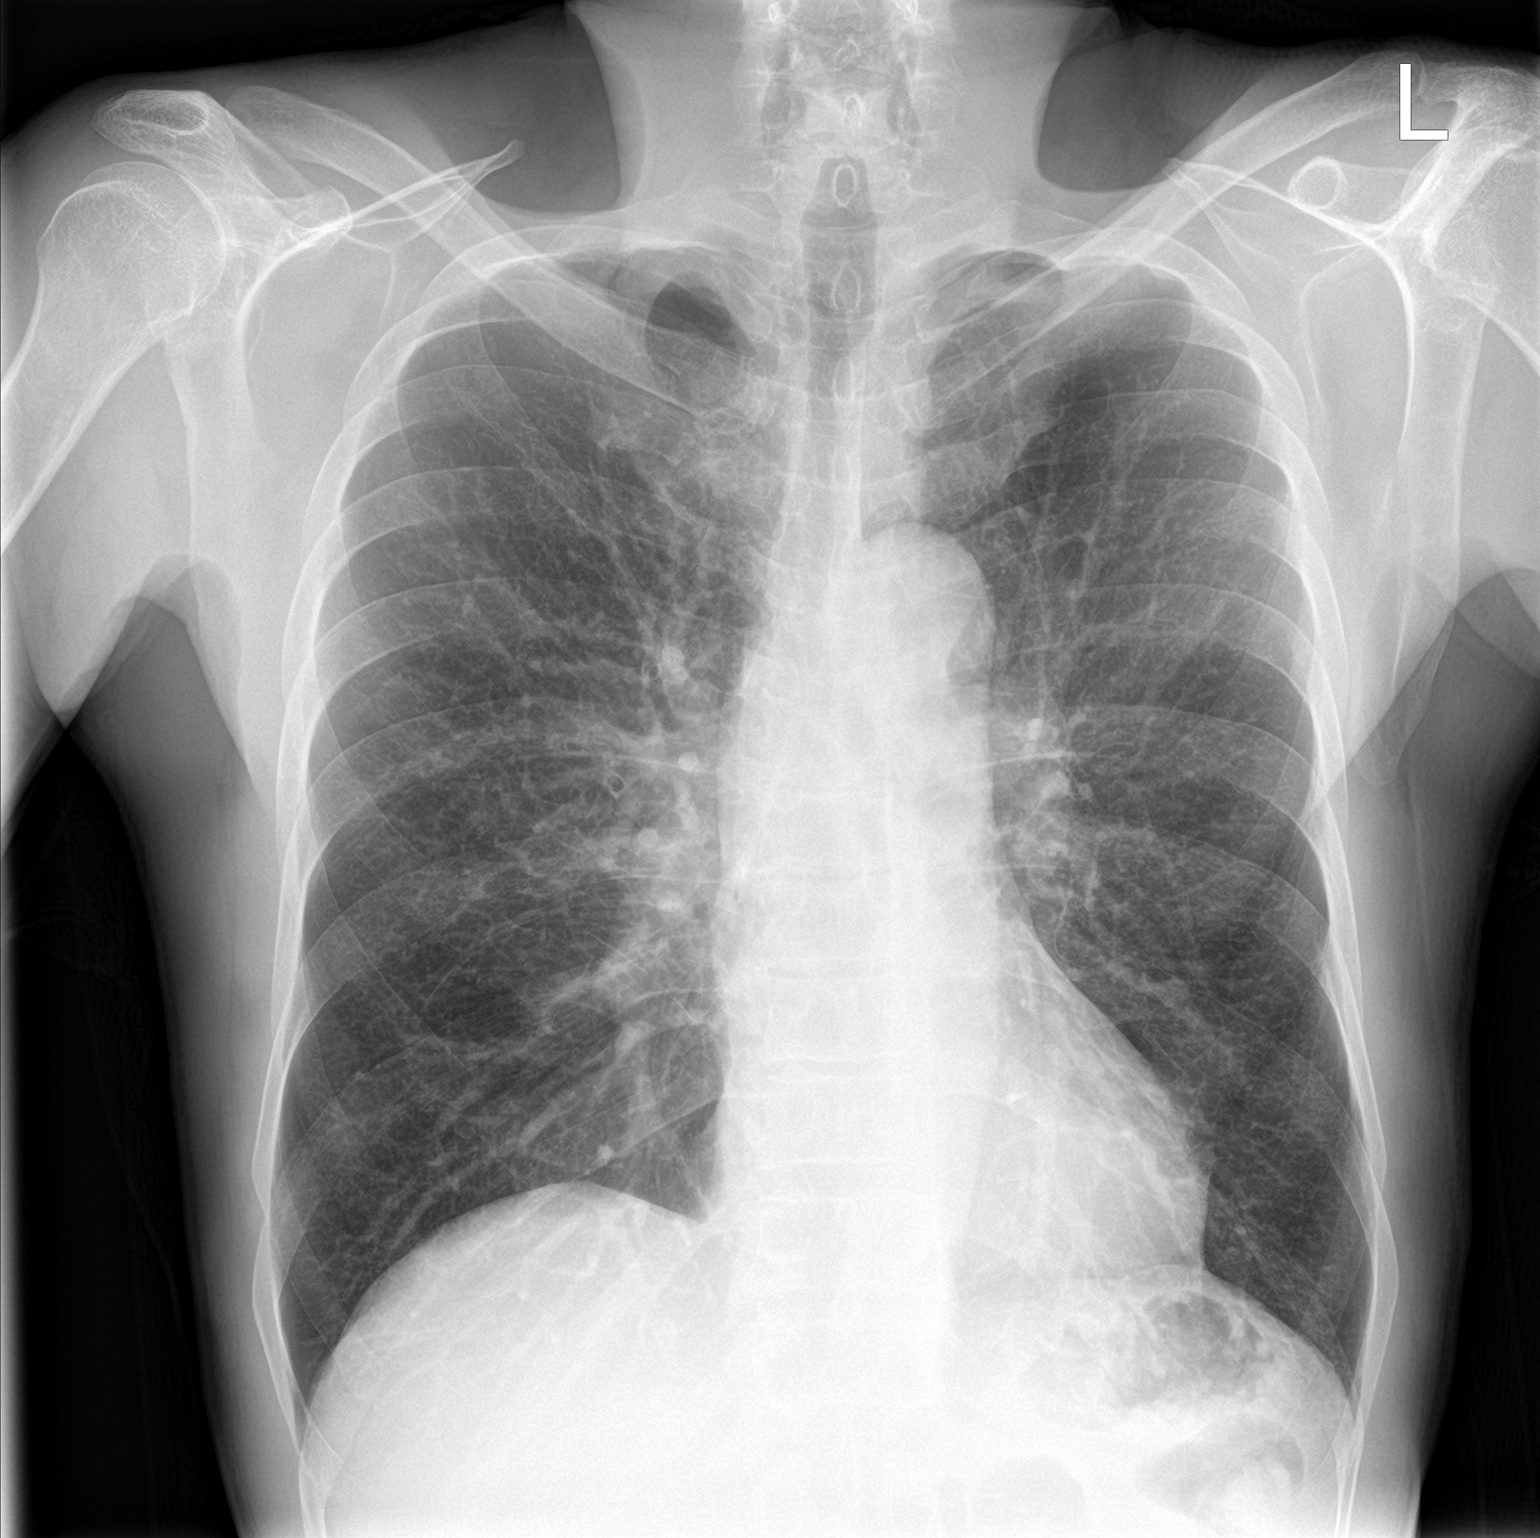

[chest lat]
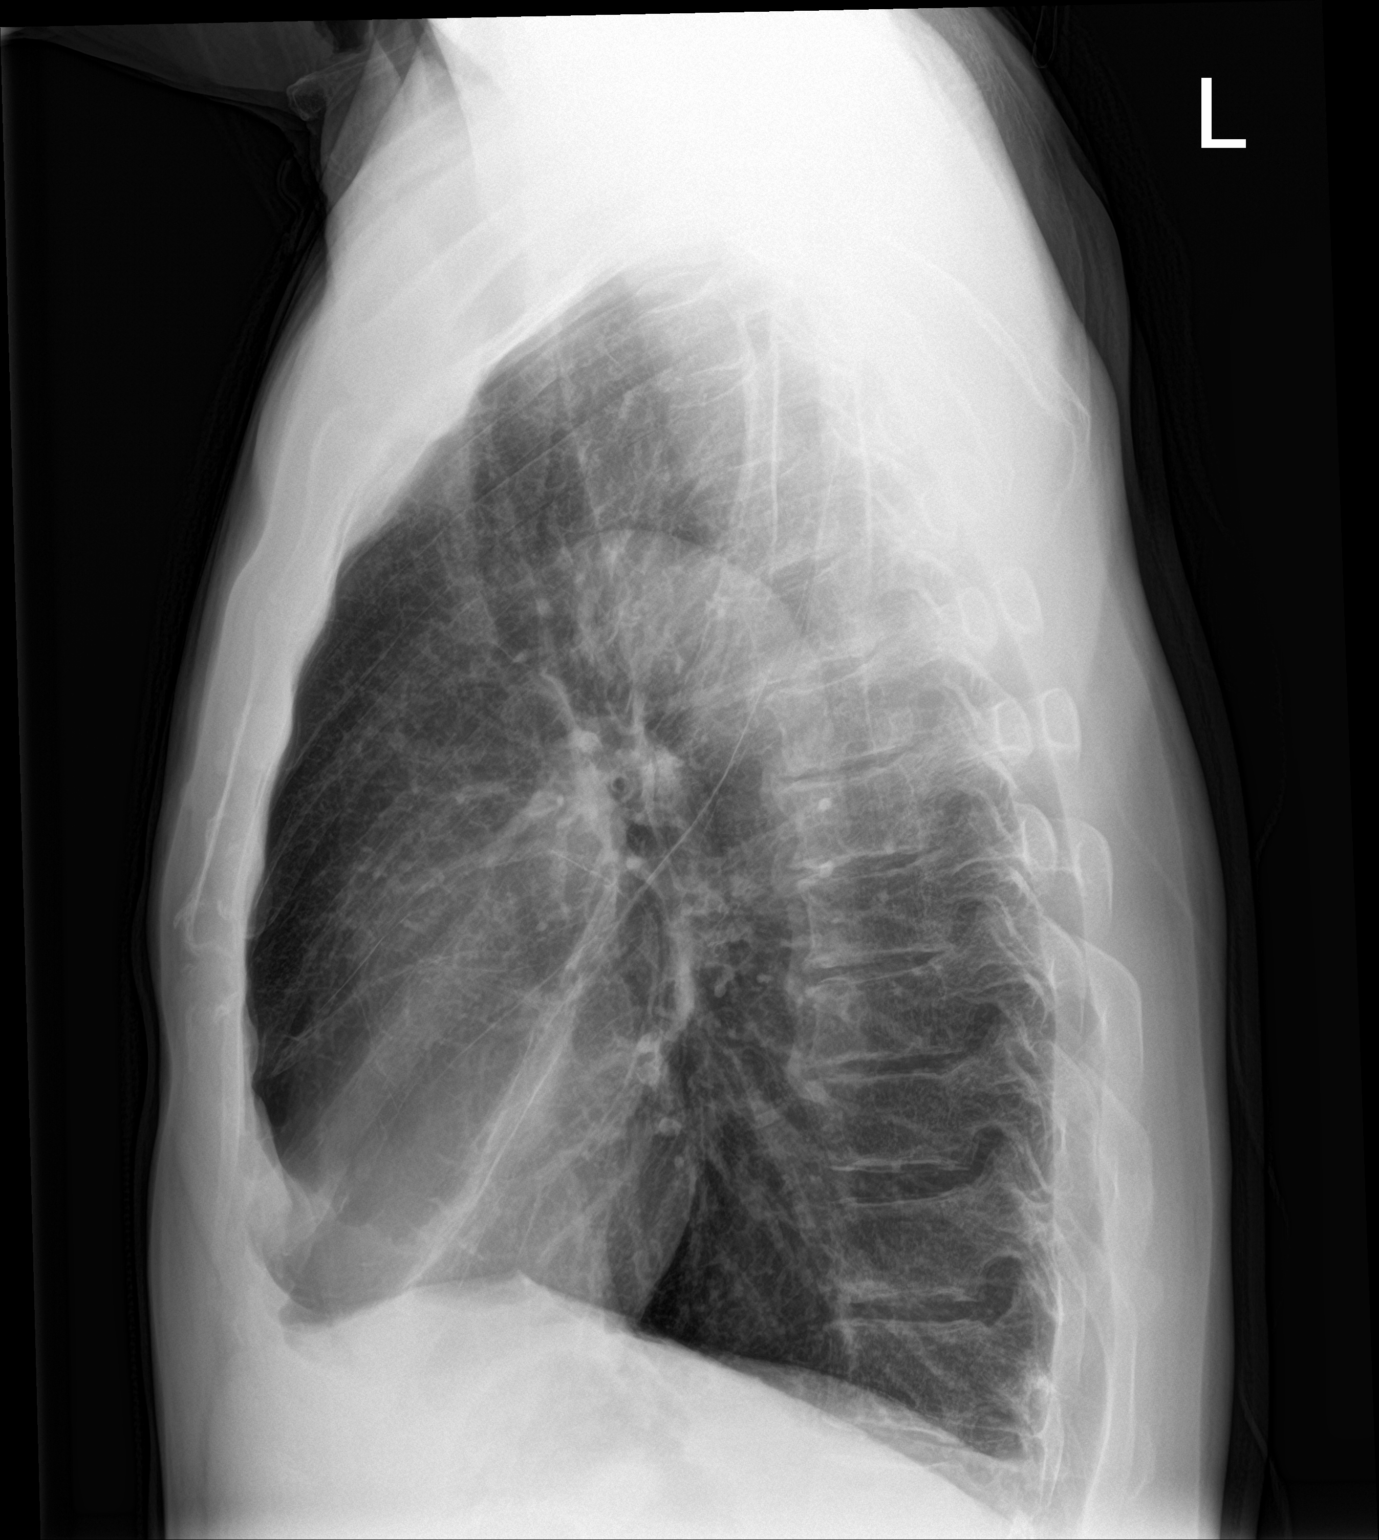

[2 of 2 positions shown; findings below may reference images not displayed]

FINDINGS: Cardiac shadow is within normal limits. The lungs are hyperinflated
bilaterally. No focal infiltrate is seen. No sizable effusion is
noted. No bony abnormality is seen.
IMPRESSION: COPD without acute abnormality.

## 2020-12-26 ENCOUNTER — Other Ambulatory Visit: Payer: Self-pay

## 2020-12-30 ENCOUNTER — Encounter (HOSPITAL_COMMUNITY): Payer: Self-pay

## 2021-03-11 ENCOUNTER — Encounter (HOSPITAL_COMMUNITY): Payer: Self-pay | Admitting: Emergency Medicine

## 2021-03-11 ENCOUNTER — Emergency Department (HOSPITAL_COMMUNITY)
Admission: EM | Admit: 2021-03-11 | Discharge: 2021-03-11 | Disposition: A | Discharge status: Home / Self Care | Payer: Medicaid Other | Attending: Emergency Medicine | Admitting: Emergency Medicine

## 2021-03-11 ENCOUNTER — Emergency Department (HOSPITAL_COMMUNITY): Payer: Medicaid Other

## 2021-03-11 DIAGNOSIS — M25511 Pain in right shoulder: Secondary | ICD-10-CM | POA: Diagnosis not present

## 2021-03-11 DIAGNOSIS — Z87891 Personal history of nicotine dependence: Secondary | ICD-10-CM | POA: Diagnosis not present

## 2021-03-11 DIAGNOSIS — M542 Cervicalgia: Secondary | ICD-10-CM | POA: Diagnosis present

## 2021-03-11 DIAGNOSIS — I10 Essential (primary) hypertension: Secondary | ICD-10-CM | POA: Insufficient documentation

## 2021-03-11 DIAGNOSIS — J449 Chronic obstructive pulmonary disease, unspecified: Secondary | ICD-10-CM | POA: Insufficient documentation

## 2021-03-11 DIAGNOSIS — M7918 Myalgia, other site: Secondary | ICD-10-CM

## 2021-03-11 DIAGNOSIS — M62838 Other muscle spasm: Secondary | ICD-10-CM

## 2021-03-11 MED ORDER — OXYCODONE-ACETAMINOPHEN 5-325 MG PO TABS
1.0000 | ORAL_TABLET | ORAL | Status: DC | PRN
  Administered 2021-03-11: 1 via ORAL
  Filled 2021-03-11: qty 1

## 2021-03-11 MED ORDER — METHOCARBAMOL 500 MG PO TABS: 500.0000 mg | ORAL_TABLET | Freq: Two times a day (BID) | ORAL | 0 refills | Status: AC

## 2021-03-11 MED ORDER — KETOROLAC TROMETHAMINE 60 MG/2ML IM SOLN
60.0000 mg | Freq: Once | INTRAMUSCULAR | Status: AC
  Administered 2021-03-11: 60 mg via INTRAMUSCULAR
  Filled 2021-03-11: qty 2

## 2021-03-11 MED ORDER — METHOCARBAMOL 500 MG PO TABS
500.0000 mg | ORAL_TABLET | Freq: Once | ORAL | Status: AC
Start: 2021-03-11 — End: 2021-03-11
  Administered 2021-03-11: 500 mg via ORAL
  Filled 2021-03-11: qty 1

## 2021-03-11 MED ORDER — LIDOCAINE 5 % EX PTCH: 1.0000 | MEDICATED_PATCH | CUTANEOUS | 0 refills | Status: AC

## 2021-03-11 MED ORDER — NAPROXEN 500 MG PO TABS: 500.0000 mg | ORAL_TABLET | Freq: Two times a day (BID) | ORAL | 0 refills | Status: DC

## 2021-03-11 NOTE — ED Provider Notes (Signed)
[facility] EMERGENCY DEPARTMENT Provider Note   CSN: [id number] Arrival date & time: 03/11/21  1359     History No chief complaint on file.   Ricky Savage is a 65 y.o. adult.  Patient is a 65 year old male with a history of hypertension and COPD who presents with right-sided neck pain.  Patient reports that he first noticed this pain when he woke up yesterday morning.  He states that it is sharp shooting that goes from his right lateral neck down his right trapezius.  He states that it is intermittent and he does have periods where he does not have any pain.  He went to bed last night woke up this morning and it was worse.  Patient has no problem with his right arm or moving at his shoulder joint.  Patient denies any chest pain.  Patient denies any shortness of breath.  Patient denies any headaches, dizziness, trouble with vision, facial weakness.  He has no history of recent neck trauma.  No history of head trauma.  Rotation of the neck does bring on the pain.  Pain has been improved by pain medication given in triage.    Past Medical History:  Diagnosis Date  . Hypertension     Patient Active Problem List   Diagnosis Date Noted  . Essential hypertension 06/27/2018  . 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 BACK SURGERY         Family History  Problem Relation Age of Onset  . Healthy Mother   . Cancer Father     Social History   Tobacco Use  . Smoking status: Former Games developer  . Smokeless tobacco: Never Used  Vaping Use  . Vaping Use: Never used  Substance Use Topics  . Alcohol use: No  . Drug use: No    Home Medications Prior to Admission medications   Medication Sig Start Date End Date Taking? Authorizing Provider  albuterol (PROVENTIL HFA;VENTOLIN HFA) 108 (90 Base) MCG/ACT  inhaler Inhale 2 puffs into the lungs every 6 (six) hours as needed for wheezing or shortness of breath. 06/05/17   Derwood Kaplan, MD  HYDROcodone-acetaminophen (NORCO/VICODIN) 5-325 MG tablet Take 1 tablet by mouth every 6 (six) hours as needed for moderate pain or severe pain. 12/03/19   Mardella Layman, MD  ibuprofen (ADVIL) 800 MG tablet Take 1 tablet (800 mg total) by mouth 3 (three) times daily with meals. 12/03/19   Mardella Layman, MD  methocarbamol (ROBAXIN) 500 MG tablet Take 1 tablet (500 mg total) by mouth 2 (two) times daily. 12/03/19   Mardella Layman, MD    Allergies    Patient has no known allergies.  Review of Systems   Review of Systems  Constitutional: Negative for chills and fever.  HENT: Negative for ear pain and sore throat.   Eyes: Negative for pain and visual disturbance.  Respiratory: Negative for cough and shortness of breath.   Cardiovascular: Negative for chest pain and palpitations.  Gastrointestinal: Negative for abdominal pain and vomiting.  Genitourinary: Negative for dysuria and hematuria.  Musculoskeletal: Positive for neck pain. Negative for back pain.  Skin: Negative for color change and rash.  Neurological: Negative for dizziness, facial asymmetry, speech difficulty, weakness, light-headedness and headaches.  All other systems reviewed and are negative.   Physical Exam Updated Vital Signs BP Marland Kitchen)  155/92 (BP Location: Right Arm)   Pulse (!) 104   Temp 97.8 F (36.6 C) (Oral)   Resp 20   SpO2 100%   Physical Exam Vitals and nursing note reviewed.  Constitutional:      General: She is not in acute distress.    Appearance: She is well-developed.  HENT:     Head: Normocephalic and atraumatic.     Right Ear: External ear normal.     Left Ear: External ear normal.     Nose: Nose normal.  Eyes:     Extraocular Movements: Extraocular movements intact.     Pupils: Pupils are equal, round, and reactive to light.  Neck:     Comments: No midline  cervical spine tenderness Cardiovascular:     Rate and Rhythm: Normal rate and regular rhythm.  Pulmonary:     Effort: Pulmonary effort is normal. No respiratory distress.     Breath sounds: Normal breath sounds. No rhonchi.  Abdominal:     Palpations: Abdomen is soft.     Tenderness: There is no abdominal tenderness.  Musculoskeletal:     Cervical back: Neck supple. No tenderness.     Comments: Right trapezial muscular tenderness. No skin changes over the area of pain. 5 out of 5 strength flexion and extension at the right wrist, elbow, shoulder. Sensation intact throughout the right arm. +2 right radial pulse.  Skin:    General: Skin is warm and dry.  Neurological:     General: No focal deficit present.     Mental Status: She is alert and oriented to person, place, and time.     Cranial Nerves: No cranial nerve deficit.     ED Results / Procedures / Treatments   Labs (all labs ordered are listed, but only abnormal results are displayed) Labs Reviewed - No data to display  EKG None  Radiology No results found.  Procedures Procedures   Medications Ordered in ED Medications  oxyCODONE-acetaminophen (PERCOCET/ROXICET) 5-325 MG per tablet 1 tablet (1 tablet Oral Given 03/11/21 1618)    ED Course  I have reviewed the triage vital signs and the nursing notes.  Pertinent labs & imaging results that were available during my care of the patient were reviewed by me and considered in my medical decision making (see chart for details).    MDM Rules/Calculators/A&P                         65 year old male who presents with right-sided neck pain. No midline cervical spine tenderness. No recent history of trauma to neck. Point tenderness over right trapezius on exam. No skin changes over area of tenderness or pain. Pain improved after percocet in triage. Pain re-triggered by movement of the neck to the right, not to the left. More consistent with right trapezius strain or spasm.  CXR ordered to rule out right upper chest tumor. Noted to be normal as above.  Patient's symptoms most consistent with musculoskeletal pain. Pain not consistent with cervical spine etiology. Pain treated with toradol and robaxin. At discharge, script for lidocaine patch, robaxin, and naproxen given for pain control at home. Stable for discharge home. Follow up with PCP.  Final Clinical Impression(s) / ED Diagnoses Final diagnoses:  None    Rx / DC Orders ED Discharge Orders    None       Kugler, [place], MD 03/11/21 2308    Lorre Nick, MD 03/12/21 1537

## 2021-03-11 NOTE — ED Notes (Signed)
DC instructions reviewed with the pt.  PT verbalized understanding. PT DC.  

## 2021-03-11 NOTE — ED Triage Notes (Signed)
Pain to R side of neck that started yesterday while at work. Denies injury.

## 2021-03-11 NOTE — ED Provider Notes (Signed)
I saw and evaluated the patient, reviewed the resident's note and I agree with the findings and plan.    65 year old male presents with right-sided neck pain after he woke this morning.  He has pinpoint tenderness along his right lateral neck.  Suspect musculoskeletal etiology.   Lorre Nick, MD 03/11/21 1901

## 2021-06-09 ENCOUNTER — Ambulatory Visit (HOSPITAL_COMMUNITY)
Admission: EM | Admit: 2021-06-09 | Discharge: 2021-06-09 | Disposition: A | Discharge status: Home / Self Care | Payer: Medicare Other

## 2021-06-09 ENCOUNTER — Encounter (HOSPITAL_COMMUNITY): Payer: Self-pay | Admitting: Emergency Medicine

## 2021-06-09 ENCOUNTER — Other Ambulatory Visit: Payer: Self-pay

## 2021-06-09 DIAGNOSIS — R1032 Left lower quadrant pain: Secondary | ICD-10-CM | POA: Diagnosis not present

## 2021-06-09 DIAGNOSIS — M79605 Pain in left leg: Secondary | ICD-10-CM | POA: Diagnosis not present

## 2021-06-09 DIAGNOSIS — I10 Essential (primary) hypertension: Secondary | ICD-10-CM | POA: Diagnosis not present

## 2021-06-09 MED ORDER — DICLOFENAC SODIUM 75 MG PO TBEC: 75.0000 mg | DELAYED_RELEASE_TABLET | Freq: Two times a day (BID) | ORAL | 0 refills | Status: AC | PRN

## 2021-06-09 NOTE — ED Triage Notes (Signed)
Pt presents with left leg pain that starts in left groin are and radiates into foot. States has been experiencing pain for months but has gotten worse over the last 2-3 days. Denies use of OTC medications for pain.

## 2021-06-09 NOTE — ED Provider Notes (Signed)
[facility]    CSN: [id number] Arrival date & time: 06/09/21  1102      History   Chief Complaint Chief Complaint  Patient presents with   Leg Pain    Left    HPI Ricky Savage is a 65 y.o. male.   65 year old male presents with left leg pain that starts near his groin and radiates down his left leg into his foot for many months. The pain has gotten worse over the past 2 to 3 days with occasional numbness in his foot. Denies any fever, abdominal pain, dysuria or change in bladder or bowel pattern. Does have chronic back pain and degenerative disc disease. No right sided pain. No trauma or injury to area. He has not taken any medication for pain. Other chronic health issues include HTN, and COPD.  Currently on Hyzaar daily and Albuterol prn. Had neck muscle spasms that he went to the ER in March 2022 and was given Robaxin which he uses prn. Last documented controlled medication (Vicodin) use for back pain was in November 2021. Was prescribed Lidoderm patch for neck pain but does not use. Does have an appointment next week with his PCP.   The history is provided by the patient.   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 Authorizing Provider  diclofenac (VOLTAREN) 75 MG EC tablet Take 1 tablet (75 mg total) by mouth every 12 (twelve) hours as needed for moderate pain. 06/09/21  Yes Josi Roediger, Ali Lowe, NP  albuterol (PROVENTIL HFA;VENTOLIN HFA) 108 (90 Base) MCG/ACT inhaler Inhale 2 puffs into the lungs every 6 (six) hours as needed for wheezing or  shortness of breath. 06/05/17   Derwood Kaplan, MD  lidocaine (LIDODERM) 5 % Place 1 patch onto the skin daily. Remove & Discard patch within 12 hours or as directed by MD 03/11/21   Kugler, [place], MD  losartan-hydrochlorothiazide (HYZAAR) 100-12.5 MG tablet Take 1 tablet by mouth daily. 05/11/21   [provider]  methocarbamol (ROBAXIN) 500 MG tablet Take 1 tablet (500 mg total) by mouth 2 (two) times daily. 03/11/21   Kugler, [place], MD    Family History Family History  Problem Relation Age of Onset   Healthy Mother    Cancer Father     Social History Social History   Tobacco Use   Smoking status: Former    Pack years: 0.00   Smokeless tobacco: Never  Vaping Use   Vaping Use: Never used  Substance Use Topics   Alcohol use: No   Drug use: No     Allergies   Patient has no known allergies.   Review of Systems Review of Systems  Constitutional:  Positive for fatigue. Negative for activity change, appetite change, chills, diaphoresis and fever.  HENT:  Negative for facial swelling and trouble swallowing.   Respiratory:  Negative for chest tightness and wheezing.   Gastrointestinal:  Negative for constipation, diarrhea, nausea and vomiting.  Genitourinary:  Negative for decreased urine volume, difficulty urinating, dysuria,  flank pain, genital sores, hematuria, penile discharge, penile pain, penile swelling, scrotal swelling and testicular pain.  Musculoskeletal:  Positive for arthralgias and back pain. Negative for neck pain and neck stiffness.  Skin:  Negative for color change and rash.  Allergic/Immunologic: Negative for environmental allergies, food allergies and immunocompromised state.  Neurological:  Positive for numbness (left foot). Negative for dizziness, tremors, seizures, syncope, facial asymmetry, speech difficulty, weakness, light-headedness and headaches.  Hematological:  Negative for adenopathy. Does not bruise/bleed easily.    Physical Exam Triage  Vital Signs ED Triage Vitals  Enc Vitals Group     BP 06/09/21 1154 (!) 140/103     Pulse Rate 06/09/21 1154 81     Resp 06/09/21 1154 16     Temp 06/09/21 1154 98.2 F (36.8 C)     Temp Source 06/09/21 1154 Oral     SpO2 06/09/21 1154 97 %     Weight --      Height --      Head Circumference --      Peak Flow --      Pain Score 06/09/21 1150 8     Pain Loc --      Pain Edu? --      Excl. in GC? --    No data found.  Updated Vital Signs BP (!) 140/103 (BP Location: Right Arm)   Pulse 81   Temp 98.2 F (36.8 C) (Oral)   Resp 16   SpO2 97%   Visual Acuity Right Eye Distance:   Left Eye Distance:   Bilateral Distance:    Right Eye Near:   Left Eye Near:    Bilateral Near:     Physical Exam Vitals and nursing note reviewed.  Constitutional:      General: He is awake. He is not in acute distress.    Appearance: He is well-developed and well-groomed.     Comments: He is sitting on the exam table in no acute distress but appears a little uncomfortable due to pain.   HENT:     Head: Normocephalic and atraumatic.     Right Ear: Hearing normal.     Left Ear: Hearing normal.  Eyes:     Extraocular Movements: Extraocular movements intact.     Conjunctiva/sclera: Conjunctivae normal.  Cardiovascular:     Rate and Rhythm: Normal rate and regular rhythm.     Heart sounds: Normal heart sounds. No murmur heard. Pulmonary:     Effort: Pulmonary effort is normal. No respiratory distress.     Breath sounds: Normal air entry. No decreased air movement. Examination of the right-upper field reveals wheezing. Examination of the left-upper field reveals wheezing. Wheezing present. No decreased breath sounds, rhonchi or rales.  Abdominal:     General: Abdomen is flat.     Palpations: Abdomen is soft.     Tenderness: There is no right CVA tenderness, left CVA tenderness, guarding or rebound.       Comments: Left lower groin tenderness along inguinal area. Prominent inguinal lymph  node on left side but similar in size and feel to right side. No distinct inguinal hernia but may be deep. Bending left leg at hip increases pain as well as extending leg. No distinct neuro deficits. Good distal pulses. No peripheral edema.   Musculoskeletal:        General: Tenderness present. No swelling. Normal range of motion.     Cervical back: Normal range of motion and neck supple.  Right lower leg: No edema.     Left lower leg: No edema.  Skin:    General: Skin is warm and dry.     Capillary Refill: Capillary refill takes less than 2 seconds.     Findings: No bruising, ecchymosis, erythema, lesion or rash.  Neurological:     General: No focal deficit present.     Mental Status: He is alert and oriented to person, place, and time.     Sensory: Sensation is intact. No sensory deficit.     Motor: Motor function is intact.     Gait: Gait is intact.  Psychiatric:        Mood and Affect: Mood normal.        Behavior: Behavior normal. Behavior is cooperative.        Thought Content: Thought content normal.        Judgment: Judgment normal.     UC Treatments / Results  Labs (all labs ordered are listed, but only abnormal results are displayed) Labs Reviewed - No data to display  EKG   Radiology No results found.  Procedures Procedures (including critical care time)  Medications Ordered in UC Medications - No data to display  Initial Impression / Assessment and Plan / UC Course  I have reviewed the triage vital signs and the nursing notes.  Pertinent labs & imaging results that were available during my care of the patient were reviewed by me and considered in my medical decision making (see chart for details).    Discussed with patient possible nerve pain and sciatica. Unable to completely rule out inguinal hernia. Continue to monitor. Will start Voltaren 75mg  every 12 hours as needed for pain. May continue Robaxin muscle relaxer at night to help with sleep. May apply  warm compresses to groin area for comfort. Discussed elevated blood pressure- continue to take Hyzaar as prescribed.  If leg/groin pain gets worse or numbness continues and spreads more in lower extremity, go to the ER ASAP. Otherwise, follow-up with your PCP as scheduled next week.  Final Clinical Impressions(s) / UC Diagnoses   Final diagnoses:  Unilateral groin pain, left  Left leg pain  Elevated blood pressure reading in office with diagnosis of hypertension     Discharge Instructions      Recommend start Voltaren 75mg  every 12 hours as needed for pain. May continue Robaxin muscle relaxer at night to help with sleep. May apply warm moist compresses to groin area for comfort. Your blood pressure is elevated today- probably due to pain. Continue taking your blood pressure medication as prescribed. Continue to monitor symptoms in groin/leg- if pain gets worse or numbness continues in lower leg, go to the ER ASAP. Otherwise, follow-up with your Primary Care Doctor as scheduled next week.      ED Prescriptions     Medication Sig Dispense Auth. Provider   diclofenac (VOLTAREN) 75 MG EC tablet Take 1 tablet (75 mg total) by mouth every 12 (twelve) hours as needed for moderate pain. 20 tablet Frenchie Dangerfield, , NP      PDMP was reviewed this encounter. Last active Rx for controlled medication was November 2021. Was receiving Rx for Vicodin on monthly basis in 2021 but no Rx in 2022. Do not feel a controlled medication is indicated at this time.    2022, NP 06/10/21 1529

## 2021-06-09 NOTE — Discharge Instructions (Addendum)
Recommend start Voltaren 75mg  every 12 hours as needed for pain. May continue Robaxin muscle relaxer at night to help with sleep. May apply warm moist compresses to groin area for comfort. Your blood pressure is elevated today- probably due to pain. Continue taking your blood pressure medication as prescribed. Continue to monitor symptoms in groin/leg- if pain gets worse or numbness continues in lower leg, go to the ER ASAP. Otherwise, follow-up with your Primary Care Doctor as scheduled next week.

## 2021-06-12 ENCOUNTER — Other Ambulatory Visit: Payer: Self-pay

## 2021-06-12 ENCOUNTER — Encounter (HOSPITAL_COMMUNITY): Payer: Self-pay

## 2021-06-12 ENCOUNTER — Emergency Department (HOSPITAL_COMMUNITY)
Admission: EM | Admit: 2021-06-12 | Discharge: 2021-06-12 | Disposition: A | Discharge status: Home / Self Care | Payer: Medicare Other | Attending: Emergency Medicine | Admitting: Emergency Medicine

## 2021-06-12 DIAGNOSIS — I10 Essential (primary) hypertension: Secondary | ICD-10-CM | POA: Insufficient documentation

## 2021-06-12 DIAGNOSIS — Z79899 Other long term (current) drug therapy: Secondary | ICD-10-CM | POA: Insufficient documentation

## 2021-06-12 DIAGNOSIS — R252 Cramp and spasm: Secondary | ICD-10-CM

## 2021-06-12 DIAGNOSIS — J449 Chronic obstructive pulmonary disease, unspecified: Secondary | ICD-10-CM | POA: Diagnosis not present

## 2021-06-12 DIAGNOSIS — R200 Anesthesia of skin: Secondary | ICD-10-CM | POA: Diagnosis not present

## 2021-06-12 NOTE — Discharge Instructions (Addendum)
Please increase the amount of water in your diet. Avoid sodas as this will increase dehydration which is likely the cause of your muscle cramp last night.  You can also increase potassium rich foods into your diet: Milk and soy milk. Fruits, such as bananas, apricots, nectarines, melon, prunes, raisins, kiwi, and oranges. Vegetables, such as potatoes, sweet potatoes, yams, tomatoes, leafy greens, beets, avocado, pumpkin, and winter squash. White and lima beans. Whole-wheat breads and pastas. Beans and nuts.  Follow up with your PCP as scheduled tomorow

## 2021-06-12 NOTE — ED Triage Notes (Signed)
Patient c/o left leg pain and states some numbness I his left foot. Patient states he continues to have muscle spasms in his left leg. Patient stated he went to [facility] last week and the meds they prescribed are not working.

## 2021-06-12 NOTE — ED Notes (Signed)
Patient left before discharge process could be completed.  

## 2021-06-12 NOTE — ED Provider Notes (Signed)
[facility]-EMERGENCY DEPT Provider Note   CSN: [id number] Arrival date & time: 06/12/21  1035     History Chief Complaint  Patient presents with   Leg Pain   Numbness    Ricky Savage is a 65 y.o. male with Pmhx HTN who presents to the ED today with complaint of left leg cramp that occurred last night while sleeping.  Patient was recently seen at urgent care on 06/17 for left-sided groin pain radiating down his left leg into his foot for many months.  He presented after worsening pain.  Provider thought that his pain was likely related to a possible nerve pain and sciatica.  No inguinal hernia palpated at that time.  Patient mentioned that he had been taking Robaxin as needed to help with sleep.  Advised to continue.  He was also provided with prescription for Voltaren 75 mg twice daily as needed for pain.  He was advised if his pain worsens or his numbness continues to spread he would need to go to the ER for further evaluation.  He does not have appointment with his PCP tomorrow.  He states that he is mostly here to address the leg cramp.  He states it was very painful and he needed to rub it out with his cell phone against his leg to help.  He is concerned that it could happen to him again while he is driving and wanted to come to the ER to address it.  He plans to see his PCP tomorrow to discuss his ongoing left leg pain.  Does endorse that he works outside daily and only drinks about 2 bottles of water per day in addition to 4-6 bottles of soda per day.  No other complaints at this time.  The history is provided by the patient and medical records.      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 Authorizing Provider  albuterol (PROVENTIL HFA;VENTOLIN HFA) 108 (90 Base) MCG/ACT inhaler Inhale 2 puffs into the lungs every 6 (six) hours as needed for wheezing or shortness of breath. 06/05/17   Derwood Kaplan, MD  diclofenac (VOLTAREN) 75 MG EC tablet Take 1 tablet (75 mg total) by mouth every 12 (twelve) hours as needed for moderate pain. 06/09/21   Sudie Grumbling, NP  lidocaine (LIDODERM) 5 % Place 1 patch onto the skin daily. Remove & Discard patch within 12 hours or as directed by MD 03/11/21   Kugler, [place], MD  losartan-hydrochlorothiazide (HYZAAR) 100-12.5 MG tablet Take 1 tablet by mouth daily. 05/11/21   [provider]  methocarbamol (ROBAXIN) 500 MG tablet Take  1 tablet (500 mg total) by mouth 2 (two) times daily. 03/11/21   Kugler, [place], MD    Allergies    Patient has no known allergies.  Review of Systems   Review of Systems  Constitutional:  Negative for fever.  Musculoskeletal:  Positive for arthralgias.       + muscle cramp  All other systems reviewed and are negative.  Physical Exam Updated Vital Signs BP (!) 152/96 (BP Location: Left Arm) Comment: Patient states he did not take his BP meds today.  Pulse 85   Temp 98.1 F (36.7 C) (Oral)   Resp 16   Ht 6' (1.829 m)   Wt 74.8 kg   SpO2 95%   BMI 22.38 kg/m   Physical Exam Vitals and nursing note reviewed.  Constitutional:      Appearance: He is not ill-appearing or diaphoretic.  HENT:     Head: Normocephalic and atraumatic.  Eyes:      Conjunctiva/sclera: Conjunctivae normal.  Cardiovascular:     Rate and Rhythm: Normal rate and regular rhythm.  Pulmonary:     Effort: Pulmonary effort is normal.     Breath sounds: Normal breath sounds.  Musculoskeletal:     Comments: ROM intact to LLE. No swelling to LLE. No tenderness to calf where pt had cramp last night. Strength and sensation intact. 2+ PT pulse.   Skin:    General: Skin is warm and dry.     Coloration: Skin is not jaundiced.  Neurological:     Mental Status: He is alert.    ED Results / Procedures / Treatments   Labs (all labs ordered are listed, but only abnormal results are displayed) Labs Reviewed - No data to display  EKG None  Radiology No results found.  Procedures Procedures   Medications Ordered in ED Medications - No data to display  ED Course  I have reviewed the triage vital signs and the nursing notes.  Pertinent labs & imaging results that were available during my care of the patient were reviewed by me and considered in my medical decision making (see chart for details).    MDM Rules/Calculators/A&P                          65 year old male who presents to the ED today with complaint of 1 incident of left lower extremity leg cramps that occurred in the middle of the night.  He is planning to follow-up with his PCP tomorrow concerning chronic left leg pain that has been ongoing for several months.  Currently on Voltaren twice daily as well as Robaxin as needed by urgent care.  He states that he is mostly here as he is concerned that he will have another leg cramp which was very painful in the middle of the night while he is driving prompting concern.  His pain is at baseline currently.  On arrival to the ED patient is afebrile, nontachycardic and nontachypneic and appears to be in no acute distress.  He has full range of motion of his left lower extremity.  No obvious swelling.  No tenderness to the calf or patient had his spasm yesterday.   Neurovascularly intact.  He does endorse that he does not drink a lot of water and drinks several sodas per day as well as working outside.  I had lengthy discussion with patient regarding dehydration is likely #1cause of patient's leg cramps.  He is vies to increase his  water intake and to cut back on sodas.  I have also discussed increasing potassium in his diet.  He will follow-up with PCP tomorrow as scheduled.  He is in agreement with plan and stable for discharge   This note was prepared using Dragon voice recognition software and may include unintentional dictation errors due to the inherent limitations of voice recognition software.  Final Clinical Impression(s) / ED Diagnoses Final diagnoses:  Muscle cramp    Rx / DC Orders ED Discharge Orders     None        Discharge Instructions      Please increase the amount of water in your diet. Avoid sodas as this will increase dehydration which is likely the cause of your muscle cramp last night.  You can also increase potassium rich foods into your diet: Milk and soy milk. Fruits, such as bananas, apricots, nectarines, melon, prunes, raisins, kiwi, and oranges. Vegetables, such as potatoes, sweet potatoes, yams, tomatoes, leafy greens, beets, avocado, pumpkin, and winter squash. White and lima beans. Whole-wheat breads and pastas. Beans and nuts.  Follow up with your PCP as scheduled tomorow       Tanda Rockers, PA-C 06/12/21 1148    Jacalyn Lefevre, MD 06/12/21 1222

## 2021-06-13 ENCOUNTER — Other Ambulatory Visit: Payer: Self-pay | Admitting: Internal Medicine

## 2021-06-14 LAB — LIPID PANEL
Cholesterol: 163 mg/dL (ref <200)
HDL: 53 mg/dL (ref >=40)
LDL Cholesterol (Calc): 91 mg/dL (calc)
Non-HDL Cholesterol (Calc): 110 mg/dL (calc) (ref <130)
Total CHOL/HDL Ratio: 3.1 (calc) (ref <5.0)
Triglycerides: 92 mg/dL (ref <150)

## 2021-06-14 LAB — COMPLETE METABOLIC PANEL WITH GFR
AG Ratio: 1.4 (calc) (ref 1.0–2.5)
ALT: 10 U/L (ref 9–46)
AST: 16 U/L (ref 10–35)
Albumin: 4.3 g/dL (ref 3.6–5.1)
Alkaline phosphatase (APISO): 76 U/L (ref 35–144)
BUN: 14 mg/dL (ref 7–25)
CO2: 24 mmol/L (ref 20–32)
Calcium: 9.6 mg/dL (ref 8.6–10.3)
Chloride: 104 mmol/L (ref 98–110)
Creat: 1.05 mg/dL (ref 0.70–1.25)
GFR, Est African American: 87 mL/min/{1.73_m2} (ref >=60)
GFR, Est Non African American: 75 mL/min/{1.73_m2} (ref >=60)
Globulin: 3 g/dL (calc) (ref 1.9–3.7)
Glucose, Bld: 72 mg/dL (ref 65–99)
Potassium: 4.2 mmol/L (ref 3.5–5.3)
Sodium: 138 mmol/L (ref 135–146)
Total Bilirubin: 0.5 mg/dL (ref 0.2–1.2)
Total Protein: 7.3 g/dL (ref 6.1–8.1)

## 2021-06-14 LAB — CBC
HCT: 41 % (ref 38.5–50.0)
Hemoglobin: 14.1 g/dL (ref 13.2–17.1)
MCH: 33.4 pg — ABNORMAL HIGH (ref 27.0–33.0)
MCHC: 34.4 g/dL (ref 32.0–36.0)
MCV: 97.2 fL (ref 80.0–100.0)
MPV: 10.4 fL (ref 7.5–12.5)
Platelets: 275 10*3/uL (ref 140–400)
RBC: 4.22 10*6/uL (ref 4.20–5.80)
RDW: 12.3 % (ref 11.0–15.0)
WBC: 5 10*3/uL (ref 3.8–10.8)

## 2021-06-14 LAB — VITAMIN D 25 HYDROXY (VIT D DEFICIENCY, FRACTURES): Vit D, 25-Hydroxy: 34 ng/mL (ref 30–100)

## 2021-06-14 LAB — TSH: TSH: 1.18 mIU/L (ref 0.40–4.50)

## 2021-06-14 LAB — PSA: PSA: 1.86 ng/mL (ref <=4.00)

## 2021-07-05 ENCOUNTER — Other Ambulatory Visit (HOSPITAL_COMMUNITY): Payer: Self-pay | Admitting: Internal Medicine

## 2021-07-05 DIAGNOSIS — I739 Peripheral vascular disease, unspecified: Secondary | ICD-10-CM

## 2021-07-10 ENCOUNTER — Inpatient Hospital Stay (HOSPITAL_COMMUNITY): Admission: RE | Admit: 2021-07-10 | Payer: Medicare Other | Source: Ambulatory Visit

## 2021-07-10 ENCOUNTER — Encounter (HOSPITAL_COMMUNITY): Payer: Medicare Other

## 2021-12-01 ENCOUNTER — Encounter (HOSPITAL_COMMUNITY): Payer: Self-pay
